# Patient Record
Sex: Male | Born: 1965 | Race: White | Hispanic: No | Marital: Married | Smoking: Never smoker
Health system: Southern US, Community
[De-identification: ages and names within clinical notes are randomized; demographics above are authoritative.]

## PROBLEM LIST (undated history)

## (undated) DIAGNOSIS — E039 Hypothyroidism, unspecified: Secondary | ICD-10-CM

## (undated) DIAGNOSIS — E785 Hyperlipidemia, unspecified: Secondary | ICD-10-CM

## (undated) DIAGNOSIS — F419 Anxiety disorder, unspecified: Secondary | ICD-10-CM

## (undated) DIAGNOSIS — M545 Low back pain, unspecified: Secondary | ICD-10-CM

## (undated) HISTORY — DX: Low back pain: M54.5

## (undated) HISTORY — PX: HERNIA REPAIR: SHX51

## (undated) HISTORY — DX: Anxiety disorder, unspecified: F41.9

## (undated) HISTORY — DX: Hyperlipidemia, unspecified: E78.5

## (undated) HISTORY — PX: CARDIAC CATHETERIZATION: SHX172

## (undated) HISTORY — DX: Hypothyroidism, unspecified: E03.9

## (undated) HISTORY — DX: Low back pain, unspecified: M54.50

---

## 1997-09-30 ENCOUNTER — Ambulatory Visit (HOSPITAL_COMMUNITY): Admission: RE | Admit: 1997-09-30 | Discharge: 1997-09-30 | Payer: Self-pay | Admitting: Gynecology

## 2002-01-07 ENCOUNTER — Encounter: Admission: RE | Admit: 2002-01-07 | Discharge: 2002-04-07 | Payer: Self-pay | Admitting: Internal Medicine

## 2002-04-06 ENCOUNTER — Encounter: Payer: Self-pay | Admitting: Neurosurgery

## 2002-04-06 ENCOUNTER — Ambulatory Visit (HOSPITAL_COMMUNITY): Admission: RE | Admit: 2002-04-06 | Discharge: 2002-04-06 | Payer: Self-pay | Admitting: Neurosurgery

## 2002-05-13 ENCOUNTER — Encounter: Admission: RE | Admit: 2002-05-13 | Discharge: 2002-08-11 | Payer: Self-pay | Admitting: Internal Medicine

## 2002-09-18 ENCOUNTER — Emergency Department (HOSPITAL_COMMUNITY): Admission: EM | Admit: 2002-09-18 | Discharge: 2002-09-18 | Payer: Self-pay

## 2002-09-19 ENCOUNTER — Encounter: Payer: Self-pay | Admitting: Emergency Medicine

## 2002-10-01 ENCOUNTER — Ambulatory Visit (HOSPITAL_COMMUNITY): Admission: RE | Admit: 2002-10-01 | Discharge: 2002-10-01 | Payer: Self-pay | Admitting: Cardiology

## 2002-10-03 ENCOUNTER — Encounter: Admission: RE | Admit: 2002-10-03 | Discharge: 2003-01-01 | Payer: Self-pay | Admitting: Internal Medicine

## 2003-04-28 ENCOUNTER — Encounter
Admission: RE | Admit: 2003-04-28 | Discharge: 2003-07-27 | Payer: Self-pay | Admitting: Physical Medicine & Rehabilitation

## 2003-05-02 ENCOUNTER — Ambulatory Visit (HOSPITAL_COMMUNITY)
Admission: RE | Admit: 2003-05-02 | Discharge: 2003-05-02 | Payer: Self-pay | Admitting: Physical Medicine & Rehabilitation

## 2003-05-19 ENCOUNTER — Encounter
Admission: RE | Admit: 2003-05-19 | Discharge: 2003-08-17 | Payer: Self-pay | Admitting: Physical Medicine & Rehabilitation

## 2003-08-26 ENCOUNTER — Encounter
Admission: RE | Admit: 2003-08-26 | Discharge: 2003-11-17 | Payer: Self-pay | Admitting: Physical Medicine & Rehabilitation

## 2003-09-16 ENCOUNTER — Ambulatory Visit: Payer: Self-pay | Admitting: Anesthesiology

## 2004-03-19 ENCOUNTER — Emergency Department (HOSPITAL_COMMUNITY): Admission: EM | Admit: 2004-03-19 | Discharge: 2004-03-19 | Payer: Self-pay | Admitting: Family Medicine

## 2004-04-13 ENCOUNTER — Encounter
Admission: RE | Admit: 2004-04-13 | Discharge: 2004-07-12 | Payer: Self-pay | Admitting: Physical Medicine & Rehabilitation

## 2004-09-29 ENCOUNTER — Encounter
Admission: RE | Admit: 2004-09-29 | Discharge: 2004-12-28 | Payer: Self-pay | Admitting: Physical Medicine & Rehabilitation

## 2004-09-29 ENCOUNTER — Ambulatory Visit: Payer: Self-pay | Admitting: Physical Medicine & Rehabilitation

## 2004-11-24 ENCOUNTER — Ambulatory Visit: Payer: Self-pay | Admitting: Physical Medicine & Rehabilitation

## 2005-01-28 ENCOUNTER — Emergency Department (HOSPITAL_COMMUNITY): Admission: EM | Admit: 2005-01-28 | Discharge: 2005-01-28 | Payer: Self-pay | Admitting: Family Medicine

## 2005-01-31 ENCOUNTER — Encounter
Admission: RE | Admit: 2005-01-31 | Discharge: 2005-05-01 | Payer: Self-pay | Admitting: Physical Medicine & Rehabilitation

## 2005-01-31 ENCOUNTER — Ambulatory Visit: Payer: Self-pay | Admitting: Physical Medicine & Rehabilitation

## 2005-02-07 ENCOUNTER — Encounter
Admission: RE | Admit: 2005-02-07 | Discharge: 2005-03-14 | Payer: Self-pay | Admitting: Physical Medicine & Rehabilitation

## 2005-03-15 ENCOUNTER — Emergency Department (HOSPITAL_COMMUNITY): Admission: EM | Admit: 2005-03-15 | Discharge: 2005-03-15 | Payer: Self-pay | Admitting: Family Medicine

## 2005-03-31 ENCOUNTER — Ambulatory Visit: Payer: Self-pay | Admitting: Endocrinology

## 2005-04-04 ENCOUNTER — Emergency Department (HOSPITAL_COMMUNITY): Admission: EM | Admit: 2005-04-04 | Discharge: 2005-04-04 | Payer: Self-pay | Admitting: Family Medicine

## 2005-04-09 ENCOUNTER — Emergency Department (HOSPITAL_COMMUNITY): Admission: EM | Admit: 2005-04-09 | Discharge: 2005-04-09 | Payer: Self-pay | Admitting: Family Medicine

## 2005-04-20 ENCOUNTER — Ambulatory Visit: Payer: Self-pay | Admitting: Physical Medicine & Rehabilitation

## 2005-04-22 ENCOUNTER — Ambulatory Visit: Payer: Self-pay | Admitting: Endocrinology

## 2005-04-30 ENCOUNTER — Emergency Department (HOSPITAL_COMMUNITY): Admission: EM | Admit: 2005-04-30 | Discharge: 2005-04-30 | Payer: Self-pay | Admitting: Emergency Medicine

## 2005-05-02 ENCOUNTER — Ambulatory Visit: Payer: Self-pay | Admitting: Endocrinology

## 2005-07-11 ENCOUNTER — Ambulatory Visit: Payer: Self-pay | Admitting: Endocrinology

## 2005-07-20 ENCOUNTER — Ambulatory Visit: Payer: Self-pay | Admitting: Physical Medicine & Rehabilitation

## 2005-07-20 ENCOUNTER — Encounter
Admission: RE | Admit: 2005-07-20 | Discharge: 2005-10-18 | Payer: Self-pay | Admitting: Physical Medicine & Rehabilitation

## 2005-08-10 ENCOUNTER — Ambulatory Visit: Payer: Self-pay | Admitting: Endocrinology

## 2005-08-31 ENCOUNTER — Ambulatory Visit: Payer: Self-pay | Admitting: Endocrinology

## 2005-09-14 ENCOUNTER — Ambulatory Visit: Payer: Self-pay | Admitting: Physical Medicine & Rehabilitation

## 2005-10-19 ENCOUNTER — Ambulatory Visit: Payer: Self-pay | Admitting: Endocrinology

## 2005-10-27 ENCOUNTER — Emergency Department (HOSPITAL_COMMUNITY): Admission: EM | Admit: 2005-10-27 | Discharge: 2005-10-27 | Payer: Self-pay | Admitting: Emergency Medicine

## 2005-10-28 ENCOUNTER — Ambulatory Visit: Payer: Self-pay | Admitting: Endocrinology

## 2005-10-31 ENCOUNTER — Ambulatory Visit: Payer: Self-pay | Admitting: Endocrinology

## 2005-11-04 ENCOUNTER — Ambulatory Visit: Payer: Self-pay | Admitting: Endocrinology

## 2005-11-14 ENCOUNTER — Ambulatory Visit: Payer: Self-pay | Admitting: Physical Medicine & Rehabilitation

## 2005-11-14 ENCOUNTER — Encounter
Admission: RE | Admit: 2005-11-14 | Discharge: 2006-02-12 | Payer: Self-pay | Admitting: Physical Medicine & Rehabilitation

## 2005-11-14 ENCOUNTER — Ambulatory Visit: Payer: Self-pay | Admitting: Endocrinology

## 2005-12-05 ENCOUNTER — Ambulatory Visit: Payer: Self-pay | Admitting: Endocrinology

## 2005-12-26 ENCOUNTER — Ambulatory Visit: Payer: Self-pay | Admitting: Internal Medicine

## 2006-01-08 ENCOUNTER — Emergency Department (HOSPITAL_COMMUNITY): Admission: EM | Admit: 2006-01-08 | Discharge: 2006-01-08 | Payer: Self-pay | Admitting: Family Medicine

## 2006-01-20 ENCOUNTER — Encounter
Admission: RE | Admit: 2006-01-20 | Discharge: 2006-04-20 | Payer: Self-pay | Admitting: Physical Medicine & Rehabilitation

## 2006-01-23 ENCOUNTER — Ambulatory Visit: Payer: Self-pay | Admitting: Physical Medicine & Rehabilitation

## 2006-03-26 ENCOUNTER — Emergency Department (HOSPITAL_COMMUNITY): Admission: EM | Admit: 2006-03-26 | Discharge: 2006-03-26 | Payer: Self-pay | Admitting: Family Medicine

## 2006-04-05 ENCOUNTER — Ambulatory Visit: Payer: Self-pay | Admitting: Endocrinology

## 2006-04-05 LAB — CONVERTED CEMR LAB
Basophils Absolute: 0.1 10*3/uL (ref 0.0–0.1)
Basophils Relative: 0.9 % (ref 0.0–1.0)
Eosinophils Relative: 2.2 % (ref 0.0–5.0)
HCT: 38.9 % — ABNORMAL LOW (ref 39.0–52.0)
Hemoglobin: 13.4 g/dL (ref 13.0–17.0)
Neutrophils Relative %: 68.5 % (ref 43.0–77.0)
RBC: 4.74 M/uL (ref 4.22–5.81)
RDW: 12.5 % (ref 11.5–14.6)
WBC: 6 10*3/uL (ref 4.5–10.5)

## 2006-04-13 ENCOUNTER — Ambulatory Visit: Payer: Self-pay | Admitting: Physical Medicine & Rehabilitation

## 2006-04-20 ENCOUNTER — Ambulatory Visit: Payer: Self-pay | Admitting: Critical Care Medicine

## 2006-06-09 ENCOUNTER — Ambulatory Visit: Payer: Self-pay | Admitting: Internal Medicine

## 2006-06-12 ENCOUNTER — Encounter: Payer: Self-pay | Admitting: Internal Medicine

## 2006-06-12 LAB — CONVERTED CEMR LAB
BUN: 11 mg/dL (ref 6–23)
CO2: 31 meq/L (ref 19–32)
Calcium: 9.1 mg/dL (ref 8.4–10.5)
Chloride: 105 meq/L (ref 96–112)
Collection Interval-CRCL: 24 hr
Creatinine Clearance: 169 mL/min — ABNORMAL HIGH (ref 75–125)
Creatinine, Ser: 0.9 mg/dL (ref 0.4–1.5)
Creatinine, Urine: 100.3 mg/dL
GFR calc Af Amer: 121 mL/min
TSH: 8.8 microintl units/mL — ABNORMAL HIGH (ref 0.35–5.50)

## 2006-07-05 ENCOUNTER — Ambulatory Visit: Payer: Self-pay | Admitting: Endocrinology

## 2006-07-29 ENCOUNTER — Encounter: Payer: Self-pay | Admitting: Endocrinology

## 2006-07-29 DIAGNOSIS — E039 Hypothyroidism, unspecified: Secondary | ICD-10-CM | POA: Insufficient documentation

## 2006-07-29 DIAGNOSIS — F411 Generalized anxiety disorder: Secondary | ICD-10-CM | POA: Insufficient documentation

## 2006-07-29 DIAGNOSIS — E109 Type 1 diabetes mellitus without complications: Secondary | ICD-10-CM | POA: Insufficient documentation

## 2006-08-28 ENCOUNTER — Encounter
Admission: RE | Admit: 2006-08-28 | Discharge: 2006-11-26 | Payer: Self-pay | Admitting: Physical Medicine & Rehabilitation

## 2006-08-28 ENCOUNTER — Ambulatory Visit: Payer: Self-pay | Admitting: Physical Medicine & Rehabilitation

## 2006-09-24 ENCOUNTER — Emergency Department (HOSPITAL_COMMUNITY): Admission: EM | Admit: 2006-09-24 | Discharge: 2006-09-24 | Payer: Self-pay | Admitting: Emergency Medicine

## 2006-10-09 ENCOUNTER — Ambulatory Visit: Payer: Self-pay | Admitting: Physical Medicine & Rehabilitation

## 2006-11-29 ENCOUNTER — Ambulatory Visit: Payer: Self-pay | Admitting: Physical Medicine & Rehabilitation

## 2006-11-29 ENCOUNTER — Encounter
Admission: RE | Admit: 2006-11-29 | Discharge: 2006-12-01 | Payer: Self-pay | Admitting: Physical Medicine & Rehabilitation

## 2006-12-29 ENCOUNTER — Ambulatory Visit: Payer: Self-pay | Admitting: Endocrinology

## 2006-12-29 DIAGNOSIS — E7800 Pure hypercholesterolemia, unspecified: Secondary | ICD-10-CM | POA: Insufficient documentation

## 2006-12-29 DIAGNOSIS — G619 Inflammatory polyneuropathy, unspecified: Secondary | ICD-10-CM | POA: Insufficient documentation

## 2006-12-29 DIAGNOSIS — G622 Polyneuropathy due to other toxic agents: Secondary | ICD-10-CM

## 2006-12-31 ENCOUNTER — Encounter: Payer: Self-pay | Admitting: Endocrinology

## 2006-12-31 LAB — CONVERTED CEMR LAB
BUN: 14 mg/dL (ref 6–23)
Creatinine, Ser: 1 mg/dL (ref 0.4–1.5)
GFR calc Af Amer: 107 mL/min
Hgb A1c MFr Bld: 7.8 % — ABNORMAL HIGH (ref 4.6–6.0)
Potassium: 4.4 meq/L (ref 3.5–5.1)
TSH: 6.48 microintl units/mL — ABNORMAL HIGH (ref 0.35–5.50)
Total CHOL/HDL Ratio: 6.1
Triglycerides: 172 mg/dL — ABNORMAL HIGH (ref 0–149)
VLDL: 34 mg/dL (ref 0–40)

## 2007-01-18 ENCOUNTER — Emergency Department (HOSPITAL_COMMUNITY): Admission: EM | Admit: 2007-01-18 | Discharge: 2007-01-18 | Payer: Self-pay | Admitting: Family Medicine

## 2007-01-29 ENCOUNTER — Encounter
Admission: RE | Admit: 2007-01-29 | Discharge: 2007-04-29 | Payer: Self-pay | Admitting: Physical Medicine & Rehabilitation

## 2007-01-29 ENCOUNTER — Ambulatory Visit: Payer: Self-pay | Admitting: Physical Medicine & Rehabilitation

## 2007-03-02 ENCOUNTER — Ambulatory Visit: Payer: Self-pay | Admitting: Physical Medicine & Rehabilitation

## 2007-03-20 ENCOUNTER — Encounter: Payer: Self-pay | Admitting: Endocrinology

## 2007-04-03 ENCOUNTER — Ambulatory Visit: Payer: Self-pay | Admitting: Physical Medicine & Rehabilitation

## 2007-04-03 ENCOUNTER — Encounter
Admission: RE | Admit: 2007-04-03 | Discharge: 2007-07-02 | Payer: Self-pay | Admitting: Physical Medicine & Rehabilitation

## 2007-04-17 ENCOUNTER — Telehealth (INDEPENDENT_AMBULATORY_CARE_PROVIDER_SITE_OTHER): Payer: Self-pay | Admitting: *Deleted

## 2007-05-08 ENCOUNTER — Ambulatory Visit: Payer: Self-pay | Admitting: Physical Medicine & Rehabilitation

## 2007-05-28 ENCOUNTER — Ambulatory Visit: Payer: Self-pay | Admitting: Physical Medicine & Rehabilitation

## 2007-06-22 ENCOUNTER — Encounter
Admission: RE | Admit: 2007-06-22 | Discharge: 2007-09-20 | Payer: Self-pay | Admitting: Physical Medicine & Rehabilitation

## 2007-06-25 ENCOUNTER — Ambulatory Visit: Payer: Self-pay | Admitting: Physical Medicine & Rehabilitation

## 2007-07-24 ENCOUNTER — Ambulatory Visit: Payer: Self-pay | Admitting: Physical Medicine & Rehabilitation

## 2007-09-06 ENCOUNTER — Ambulatory Visit: Payer: Self-pay | Admitting: Physical Medicine & Rehabilitation

## 2007-10-04 ENCOUNTER — Encounter
Admission: RE | Admit: 2007-10-04 | Discharge: 2007-10-08 | Payer: Self-pay | Admitting: Physical Medicine & Rehabilitation

## 2007-10-08 ENCOUNTER — Ambulatory Visit: Payer: Self-pay | Admitting: Physical Medicine & Rehabilitation

## 2007-12-14 ENCOUNTER — Emergency Department (HOSPITAL_COMMUNITY): Admission: EM | Admit: 2007-12-14 | Discharge: 2007-12-14 | Payer: Self-pay | Admitting: Family Medicine

## 2007-12-27 ENCOUNTER — Encounter
Admission: RE | Admit: 2007-12-27 | Discharge: 2007-12-31 | Payer: Self-pay | Admitting: Physical Medicine & Rehabilitation

## 2007-12-31 ENCOUNTER — Ambulatory Visit: Payer: Self-pay | Admitting: Physical Medicine & Rehabilitation

## 2008-01-30 ENCOUNTER — Encounter: Payer: Self-pay | Admitting: Endocrinology

## 2008-03-17 ENCOUNTER — Emergency Department (HOSPITAL_COMMUNITY): Admission: EM | Admit: 2008-03-17 | Discharge: 2008-03-17 | Payer: Self-pay | Admitting: Family Medicine

## 2008-03-25 ENCOUNTER — Ambulatory Visit: Payer: Self-pay | Admitting: Endocrinology

## 2008-03-25 ENCOUNTER — Encounter
Admission: RE | Admit: 2008-03-25 | Discharge: 2008-03-25 | Payer: Self-pay | Admitting: Physical Medicine & Rehabilitation

## 2008-03-25 DIAGNOSIS — J309 Allergic rhinitis, unspecified: Secondary | ICD-10-CM | POA: Insufficient documentation

## 2008-06-03 ENCOUNTER — Emergency Department (HOSPITAL_COMMUNITY): Admission: EM | Admit: 2008-06-03 | Discharge: 2008-06-03 | Payer: Self-pay | Admitting: Family Medicine

## 2008-10-16 ENCOUNTER — Encounter
Admission: RE | Admit: 2008-10-16 | Discharge: 2008-10-20 | Payer: Self-pay | Admitting: Physical Medicine & Rehabilitation

## 2008-10-20 ENCOUNTER — Ambulatory Visit: Payer: Self-pay | Admitting: Physical Medicine & Rehabilitation

## 2008-10-28 ENCOUNTER — Ambulatory Visit: Payer: Self-pay | Admitting: Endocrinology

## 2008-10-28 ENCOUNTER — Telehealth: Payer: Self-pay | Admitting: Endocrinology

## 2008-10-28 DIAGNOSIS — R059 Cough, unspecified: Secondary | ICD-10-CM | POA: Insufficient documentation

## 2008-10-28 DIAGNOSIS — R05 Cough: Secondary | ICD-10-CM

## 2008-11-05 ENCOUNTER — Telehealth: Payer: Self-pay | Admitting: Endocrinology

## 2008-11-05 ENCOUNTER — Encounter: Payer: Self-pay | Admitting: Endocrinology

## 2008-11-11 ENCOUNTER — Telehealth: Payer: Self-pay | Admitting: Endocrinology

## 2008-12-25 ENCOUNTER — Encounter
Admission: RE | Admit: 2008-12-25 | Discharge: 2009-01-07 | Payer: Self-pay | Admitting: Physical Medicine & Rehabilitation

## 2008-12-25 ENCOUNTER — Ambulatory Visit: Payer: Self-pay | Admitting: Physical Medicine & Rehabilitation

## 2009-01-05 ENCOUNTER — Telehealth: Payer: Self-pay | Admitting: Endocrinology

## 2009-01-23 ENCOUNTER — Encounter
Admission: RE | Admit: 2009-01-23 | Discharge: 2009-04-23 | Payer: Self-pay | Admitting: Physical Medicine & Rehabilitation

## 2009-02-20 ENCOUNTER — Ambulatory Visit: Payer: Self-pay | Admitting: Physical Medicine & Rehabilitation

## 2009-05-13 ENCOUNTER — Ambulatory Visit: Payer: Self-pay | Admitting: Internal Medicine

## 2009-05-13 DIAGNOSIS — N529 Male erectile dysfunction, unspecified: Secondary | ICD-10-CM | POA: Insufficient documentation

## 2009-05-13 DIAGNOSIS — L989 Disorder of the skin and subcutaneous tissue, unspecified: Secondary | ICD-10-CM | POA: Insufficient documentation

## 2009-08-06 ENCOUNTER — Encounter (INDEPENDENT_AMBULATORY_CARE_PROVIDER_SITE_OTHER): Payer: Self-pay | Admitting: *Deleted

## 2009-08-06 ENCOUNTER — Ambulatory Visit: Payer: Self-pay | Admitting: Internal Medicine

## 2009-08-06 DIAGNOSIS — J019 Acute sinusitis, unspecified: Secondary | ICD-10-CM | POA: Insufficient documentation

## 2009-08-06 DIAGNOSIS — R21 Rash and other nonspecific skin eruption: Secondary | ICD-10-CM | POA: Insufficient documentation

## 2009-08-06 DIAGNOSIS — E538 Deficiency of other specified B group vitamins: Secondary | ICD-10-CM | POA: Insufficient documentation

## 2009-08-06 DIAGNOSIS — E559 Vitamin D deficiency, unspecified: Secondary | ICD-10-CM | POA: Insufficient documentation

## 2009-08-06 DIAGNOSIS — R5383 Other fatigue: Secondary | ICD-10-CM

## 2009-08-06 DIAGNOSIS — R5381 Other malaise: Secondary | ICD-10-CM | POA: Insufficient documentation

## 2009-08-07 ENCOUNTER — Ambulatory Visit: Payer: Self-pay | Admitting: Internal Medicine

## 2009-08-07 LAB — CONVERTED CEMR LAB
ALT: 16 units/L (ref 0–53)
Alkaline Phosphatase: 86 units/L (ref 39–117)
Basophils Relative: 0.4 % (ref 0.0–3.0)
Bilirubin, Direct: 0.1 mg/dL (ref 0.0–0.3)
Calcium: 8.5 mg/dL (ref 8.4–10.5)
Creatinine, Ser: 0.9 mg/dL (ref 0.4–1.5)
Direct LDL: 95.2 mg/dL
Eosinophils Relative: 3.9 % (ref 0.0–5.0)
Hgb A1c MFr Bld: 7.8 % — ABNORMAL HIGH (ref 4.6–6.5)
Lymphocytes Relative: 20.4 % (ref 12.0–46.0)
Microalb Creat Ratio: 0.5 mg/g (ref 0.0–30.0)
Microalb, Ur: 1.6 mg/dL (ref 0.0–1.9)
Monocytes Relative: 7.3 % (ref 3.0–12.0)
Neutrophils Relative %: 68 % (ref 43.0–77.0)
PSA: 0.39 ng/mL (ref 0.10–4.00)
RBC: 4.76 M/uL (ref 4.22–5.81)
Saturation Ratios: 27 % (ref 20.0–50.0)
Total CHOL/HDL Ratio: 5
Total Protein: 7.3 g/dL (ref 6.0–8.3)
Triglycerides: 305 mg/dL — ABNORMAL HIGH (ref 0.0–149.0)
WBC: 6.5 10*3/uL (ref 4.5–10.5)

## 2009-09-09 ENCOUNTER — Ambulatory Visit: Payer: Self-pay | Admitting: Internal Medicine

## 2009-11-24 ENCOUNTER — Telehealth: Payer: Self-pay | Admitting: Endocrinology

## 2009-11-27 ENCOUNTER — Ambulatory Visit: Payer: Self-pay | Admitting: Endocrinology

## 2009-12-22 ENCOUNTER — Ambulatory Visit: Payer: Self-pay | Admitting: Endocrinology

## 2009-12-22 DIAGNOSIS — R109 Unspecified abdominal pain: Secondary | ICD-10-CM | POA: Insufficient documentation

## 2009-12-28 ENCOUNTER — Ambulatory Visit: Payer: Self-pay | Admitting: Endocrinology

## 2010-01-06 ENCOUNTER — Ambulatory Visit: Payer: Self-pay | Admitting: Endocrinology

## 2010-01-28 ENCOUNTER — Ambulatory Visit
Admission: RE | Admit: 2010-01-28 | Discharge: 2010-01-28 | Payer: Self-pay | Source: Home / Self Care | Attending: Endocrinology | Admitting: Endocrinology

## 2010-02-03 ENCOUNTER — Encounter: Payer: Self-pay | Admitting: Endocrinology

## 2010-02-08 ENCOUNTER — Encounter: Payer: Self-pay | Admitting: Endocrinology

## 2010-02-09 NOTE — Assessment & Plan Note (Signed)
Summary: red raised mole on back/sae/cd   Vital Signs:  Patient profile:   45 year old male Height:      73 inches Weight:      242.25 pounds BMI:     32.08 O2 Sat:      97 % on Room air Temp:     98 degrees F oral Pulse rate:   73 / minute BP sitting:   110 / 84  (left arm) Cuff size:   large  Vitals Entered ByZella Ball Ewing (May 13, 2009 2:35 PM)  O2 Flow:  Room air CC: Mole on back/RE   CC:  Mole on back/RE.  History of Present Illness: here with darkened area around a red lesion to the left scapular area and wife urged him to come have looked at , worried about melanoma.  Also with 4 mo worsening ED symptoms, used to be infreq but now just about every instance over last 4 months and becoming signficant problem.  Pt denies CP, sob, doe, wheezing, orthopnea, pnd, worsening LE edema, palps, dizziness or syncope   Pt denies new neuro symptoms such as headache, facial or extremity weakness .  Denies worsening depressive symptoms, stress, anxeity, panic or suiciidal ideation.    Preventive Screening-Counseling & Management  Alcohol-Tobacco     Smoking Status: never      Drug Use:  no.    Problems Prior to Update: 1)  Cough  (ICD-786.2) 2)  Allergic Rhinitis  (ICD-477.9) 3)  Polyneuropathy  (ICD-357.9) 4)  Hypercholesterolemia  (ICD-272.0) 5)  Hypothyroidism  (ICD-244.9) 6)  Diabetes Mellitus, Type I  (ICD-250.01) 7)  Anxiety  (ICD-300.00)  Medications Prior to Update: 1)  Insulin Pump [id number]   Kit (Insulin Infusion Pump) .... See Office Notes 2)  Synthroid 125 Mcg  Tabs (Levothyroxine Sodium) .... Take 1 By Mouth Qd 3)  Trazodone Hcl 50 Mg  Tabs (Trazodone Hcl) .... Take 1 By Mouth Qhs 4)  Crestor 40 Mg  Tabs (Rosuvastatin Calcium) .... Take 1/2 Tab By Mouth Qd 5)  Endocet 7.5-325 Mg  Tabs (Oxycodone-Acetaminophen) .... Take 1 By Mouth Qd 6)  Lisinopril 5 Mg  Tabs (Lisinopril) .... Take 1 By Mouth Qd 7)  Ultram Er 300 Mg  Tb24 (Tramadol Hcl) .... Take 1 By Mouth Qd 8)   Neurontin 400 Mg  Caps (Gabapentin) .... Take 1 By Mouth Three Times A Day Qd 9)  Cefuroxime Axetil 250 Mg Tabs (Cefuroxime Axetil) .Marland Kitchen.. 1 Bid 10)  Benzonatate 100 Mg Caps (Benzonatate) .Marland Kitchen.. 1 Three Times A Day As Needed Cough 11)  Onetouch Ultra Test  Strp (Glucose Blood) .... Test Blood Sugar 5 Times A Day  Current Medications (verified): 1)  Insulin Pump [id number]   Kit (Insulin Infusion Pump) .... See Office Notes 2)  Synthroid 125 Mcg  Tabs (Levothyroxine Sodium) .... Take 1 By Mouth Qd 3)  Trazodone Hcl 50 Mg  Tabs (Trazodone Hcl) .... Take 1 By Mouth Qhs 4)  Crestor 40 Mg  Tabs (Rosuvastatin Calcium) .... Take 1/2 Tab By Mouth Qd 5)  Endocet 7.5-325 Mg  Tabs (Oxycodone-Acetaminophen) .... Take 1 By Mouth Qd 6)  Lisinopril 5 Mg  Tabs (Lisinopril) .... Take 1 By Mouth Qd 7)  Ultram Er 300 Mg  Tb24 (Tramadol Hcl) .... Take 1 By Mouth Qd 8)  Neurontin 400 Mg  Caps (Gabapentin) .... Take 1 By Mouth Three Times A Day Qd 9)  Cefuroxime Axetil 250 Mg Tabs (Cefuroxime Axetil) .Marland Kitchen.. 1 Bid 10)  Benzonatate 100 Mg Caps (Benzonatate) .Marland Kitchen.. 1 Three Times A Day As Needed Cough 11)  Onetouch Ultra Test  Strp (Glucose Blood) .... Test Blood Sugar 5 Times A Day 12)  Cialis 20 Mg Tabs (Tadalafil) .Marland Kitchen.. 1 Po Every Other Day As Needed  Allergies (verified): No Known Drug Allergies  Past History:  Past Medical History: Last updated: 07/29/2006 Anxiety Diabetes mellitus, type I Low back pain (NL) Cath (09811 Dylipidemia Hypothyroidism  Past Surgical History: Last updated: 07/29/2006 EKG (07/05/2006)  Risk Factors: Smoking Status: never (05/13/2009)  Social History: Reviewed history and no changes required. Married Never Smoked Drug use-no Drug Use:  no  Review of Systems       all otherwise negative per pt -    Physical Exam  General:  alert and overweight-appearing.   Head:  normocephalic and atraumatic.   Eyes:  vision grossly intact, pupils equal, and pupils round.   Ears:  R ear  normal and L ear normal.   Nose:  no external deformity and no nasal discharge.   Mouth:  no gingival abnormalities and pharynx pink and moist.   Neck:  supple and no masses.   Lungs:  normal respiratory effort and normal breath sounds.   Heart:  normal rate and regular rhythm.   Genitalia:  Testes bilaterally descended without nodularity, tenderness or masses. No scrotal masses or lesions. No penis lesions or urethral discharge. Extremities:  no edema, no erythema  Skin:  color normal and no rashes.  ; does have a left periscapular cherry angioma approx 5 mm, with mild  slight darkened area nonraised in circular area around it approx 10 mm c/w postinflammatory hyperpigmentation (has been scratching at it) - no characteristic of melanoma Psych:  not depressed appearing and moderately anxious.     Impression & Recommendations:  Problem # 1:  SKIN LESION (ICD-709.9) d/w pt - benign in appearance and  area around the cherry angioma should fade with time;  ok to follow  Problem # 2:  ERECTILE DYSFUNCTION, ORGANIC (ICD-607.84)  His updated medication list for this problem includes:    Cialis 20 Mg Tabs (Tadalafil) .Marland Kitchen... 1 po every other day as needed most likely organic given comorbids, but cant r/o psych component;  treat as above, f/u any worsening signs or symptoms   Problem # 3:  ANXIETY (ICD-300.00)  His updated medication list for this problem includes:    Trazodone Hcl 50 Mg Tabs (Trazodone hcl) .Marland Kitchen... Take 1 by mouth qhs stable overall by hx and exam, ok to continue meds/tx as is   Complete Medication List: 1)  Insulin Pump Ir1250 Kit (Insulin infusion pump) .... See office notes 2)  Synthroid 125 Mcg Tabs (Levothyroxine sodium) .... Take 1 by mouth qd 3)  Trazodone Hcl 50 Mg Tabs (Trazodone hcl) .... Take 1 by mouth qhs 4)  Crestor 40 Mg Tabs (Rosuvastatin calcium) .... Take 1/2 tab by mouth qd 5)  Endocet 7.5-325 Mg Tabs (Oxycodone-acetaminophen) .... Take 1 by mouth qd 6)   Lisinopril 5 Mg Tabs (Lisinopril) .... Take 1 by mouth qd 7)  Ultram Er 300 Mg Tb24 (Tramadol hcl) .... Take 1 by mouth qd 8)  Neurontin 400 Mg Caps (Gabapentin) .... Take 1 by mouth three times a day qd 9)  Cefuroxime Axetil 250 Mg Tabs (Cefuroxime axetil) .Marland Kitchen.. 1 bid 10)  Benzonatate 100 Mg Caps (Benzonatate) .Marland Kitchen.. 1 three times a day as needed cough 11)  Onetouch Ultra Test Strp (Glucose blood) .... Test blood sugar  5 times a day 12)  Cialis 20 Mg Tabs (Tadalafil) .Marland Kitchen.. 1 po every other day as needed   Patient Instructions: 1)  please use the free coupon and prescription for cialis, and the regular prescription as needed 2)  you can call to change to viagra if you want, if the cialis does not seem to be effective 3)  Please schedule an appointment with your primary doctor as needed Prescriptions: CIALIS 20 MG TABS (TADALAFIL) 1 po every other day as needed  #10 x 11   Entered and Authorized by:   Corwin Levins MD   Signed by:   Corwin Levins MD on 05/13/2009   Method used:   Print then Give to Patient   RxID:   [id number] CIALIS 20 MG TABS (TADALAFIL) 1 po every other day as needed  #3 x 0   Entered and Authorized by:   Corwin Levins MD   Signed by:   Corwin Levins MD on 05/13/2009   Method used:   Print then Give to Patient   RxID:   [id number]

## 2010-02-09 NOTE — Assessment & Plan Note (Signed)
Summary: PER ROBIN SCHED--B12 --JWJ---STC  Nurse Visit   Allergies: No Known Drug Allergies  Medication Administration  Injection # 1:    Medication: Vit B12 1000 mcg    Diagnosis: VITAMIN B12 DEFICIENCY (ICD-266.2)    Route: IM    Site: R deltoid    Exp Date: 04/11/2011    Lot #: [id number]    Mfr: American Regent    Patient tolerated injection without complications    Given by: Margaret Pyle, CMA (August 07, 2009 9:40 AM)  Orders Added: 1)  Admin of Therapeutic Inj  intramuscular or subcutaneous [96372] 2)  Vit B12 1000 mcg [J3420]

## 2010-02-09 NOTE — Progress Notes (Signed)
  Phone Note Call from Patient Call back at Home Phone [phone]   Caller: Patient Summary of Call: Pt called requesting directions on test strips be changed from five times a day to 8-10 per day as this is how many times pt actuallly checks CBGs due to Insulin pump, Pt is also requesting a 1 year Rx at a time. Initial call taken by: Margaret Pyle, CMA,  November 24, 2009 2:19 PM  Follow-up for Phone Call        ov is due.  please refill x 1, pending this ov.   Follow-up by: Minus Breeding MD,  November 24, 2009 2:43 PM    New/Updated Medications: ONETOUCH ULTRA TEST  STRP (GLUCOSE BLOOD) Test blood sugar 8-9 times daily Prescriptions: ONETOUCH ULTRA TEST  STRP (GLUCOSE BLOOD) Test blood sugar 8-9 times daily  #300 x 0   Entered by:   Margaret Pyle, CMA   Authorized by:   Minus Breeding MD   Signed by:   Margaret Pyle, CMA on 11/24/2009   Method used:   Electronically to        [facility]  [address] [id number]* (retail)       [address]       [place], [place]  [address]       Ph: [phone] or [phone]       Fax: [phone]   RxID:   [id number]

## 2010-02-09 NOTE — Assessment & Plan Note (Signed)
Summary: PER WIFE --1 MTH B12--JWJ--STC  Nurse Visit   Allergies: No Known Drug Allergies  Medication Administration  Injection # 1:    Medication: Vit B12 1000 mcg    Diagnosis: VITAMIN B12 DEFICIENCY (ICD-266.2)    Route: IM    Site: L deltoid    Exp Date: 04/11/2011    Lot #: [id number]    Mfr: American Regent    Patient tolerated injection without complications    Given by: Margaret Pyle, CMA (September 09, 2009 9:56 AM)  Orders Added: 1)  Admin of Therapeutic Inj  intramuscular or subcutaneous [96372] 2)  Vit B12 1000 mcg [J3420]

## 2010-02-09 NOTE — Letter (Signed)
Summary: Out of Work  [facility]  [address] [place], [place] [address]   Phone: [phone]  Fax: [phone]    August 06, 2009   Employee:  MARSHON BANGS Arteaga    To Whom It May Concern:   For Medical reasons, please excuse the above named employee from work for the following dates:  Start:   August 05, 2009  End:   August 06, 2909  If you need additional information, please feel free to contact our office.         Sincerely,    Dr. Oliver Barre

## 2010-02-09 NOTE — Assessment & Plan Note (Signed)
Summary: PER WIFE B12 INJ  SAE--STC  Nurse Visit   Allergies: No Known Drug Allergies  Medication Administration  Injection # 1:    Medication: Vit B12 1000 mcg    Diagnosis: VITAMIN B12 DEFICIENCY (ICD-266.2)    Route: IM    Site: R deltoid    Exp Date: 08/11/2011    Lot #: [id number]    Mfr: American Regent    Patient tolerated injection without complications    Given by: Margaret Pyle, CMA (November 27, 2009 10:45 AM)  Orders Added: 1)  Admin of Therapeutic Inj  intramuscular or subcutaneous [96372] 2)  Vit B12 1000 mcg [J3420]

## 2010-02-09 NOTE — Miscellaneous (Signed)
  Clinical Lists Changes  Problems: Added new problem of VITAMIN B12 DEFICIENCY (ICD-266.2) Medications: Added new medication of CYANOCOBALAMIN 1000 MCG/ML SOLN (CYANOCOBALAMIN) 1 cc IM q month 

## 2010-02-09 NOTE — Assessment & Plan Note (Signed)
Summary: swollen under left eye/sae/cd   Vital Signs:  Patient profile:   45 year old male Height:      73 inches Weight:      241.50 pounds BMI:     31.98 O2 Sat:      97 % on Room air Temp:     98.6 degrees F oral Pulse rate:   96 / minute BP sitting:   112 / 72  (left arm) Cuff size:   large  Vitals Entered By: Zella Ball Ewing CMA (AAMA) (August 06, 2009 11:14 AM)  O2 Flow:  Room air CC: Swollen, red and burning under left eye/RE   CC:  Swollen and red and burning under left eye/RE.  History of Present Illness: here with acute onset x 3 days with facial pain, pressure, fever and greenish d/c;  also with mild headache, nausea but on abd pain, vomiting or diarrhea.  No rash or joint problems.  Also with a small rash to the skin of the face just below the left eye with burning and itching, but cannot recall any specific  contact to cause a rash such as poison ivy or other.  Also with unsually marked fatigue with this illness and requests labs.  Pt denies polydipsia, polyuria, or low sugar symptoms such as shakiness improved with eating.  Overall good compliance with meds, trying to follow low chol, DM diet, wt stable, little excercise however   Problems Prior to Update: 1)  Special Screening Malig Neoplasms Other Sites  (ICD-V76.49) 2)  Vitamin D Deficiency  (ICD-268.9) 3)  Fatigue  (ICD-780.79) 4)  Rash-nonvesicular  (ICD-782.1) 5)  Rash-nonvesicular  (ICD-782.1) 6)  Sinusitis- Acute-nos  (ICD-461.9) 7)  Erectile Dysfunction, Organic  (ICD-607.84) 8)  Skin Lesion  (ICD-709.9) 9)  Cough  (ICD-786.2) 10)  Allergic Rhinitis  (ICD-477.9) 11)  Polyneuropathy  (ICD-357.9) 12)  Hypercholesterolemia  (ICD-272.0) 13)  Hypothyroidism  (ICD-244.9) 14)  Diabetes Mellitus, Type I  (ICD-250.01) 15)  Anxiety  (ICD-300.00)  Medications Prior to Update: 1)  Insulin Pump [id number]   Kit (Insulin Infusion Pump) .... See Office Notes 2)  Synthroid 125 Mcg  Tabs (Levothyroxine Sodium) .... Take 1 By  Mouth Qd 3)  Trazodone Hcl 50 Mg  Tabs (Trazodone Hcl) .... Take 1 By Mouth Qhs 4)  Crestor 40 Mg  Tabs (Rosuvastatin Calcium) .... Take 1/2 Tab By Mouth Qd 5)  Endocet 7.5-325 Mg  Tabs (Oxycodone-Acetaminophen) .... Take 1 By Mouth Qd 6)  Lisinopril 5 Mg  Tabs (Lisinopril) .... Take 1 By Mouth Qd 7)  Ultram Er 300 Mg  Tb24 (Tramadol Hcl) .... Take 1 By Mouth Qd 8)  Neurontin 400 Mg  Caps (Gabapentin) .... Take 1 By Mouth Three Times A Day Qd 9)  Cefuroxime Axetil 250 Mg Tabs (Cefuroxime Axetil) .Marland Kitchen.. 1 Bid 10)  Benzonatate 100 Mg Caps (Benzonatate) .Marland Kitchen.. 1 Three Times A Day As Needed Cough 11)  Onetouch Ultra Test  Strp (Glucose Blood) .... Test Blood Sugar 5 Times A Day 12)  Cialis 20 Mg Tabs (Tadalafil) .Marland Kitchen.. 1 Po Every Other Day As Needed  Current Medications (verified): 1)  Insulin Pump Ir1250   Kit (Insulin Infusion Pump) .... See Office Notes 2)  Synthroid 125 Mcg  Tabs (Levothyroxine Sodium) .... Take 1 By Mouth Qd 3)  Trazodone Hcl 50 Mg  Tabs (Trazodone Hcl) .... Take 1 By Mouth Qhs 4)  Crestor 40 Mg  Tabs (Rosuvastatin Calcium) .... Take 1/2 Tab By Mouth Qd 5)  Endocet 7.5-325  Mg  Tabs (Oxycodone-Acetaminophen) .... Take 1 By Mouth Qd 6)  Lisinopril 5 Mg  Tabs (Lisinopril) .... Take 1 By Mouth Qd 7)  Ultram Er 300 Mg  Tb24 (Tramadol Hcl) .... Take 1 By Mouth Qd 8)  Neurontin 400 Mg  Caps (Gabapentin) .... Take 1 By Mouth Three Times A Day Qd 9)  Cefuroxime Axetil 250 Mg Tabs (Cefuroxime Axetil) .Marland Kitchen.. 1 Bid 10)  Benzonatate 100 Mg Caps (Benzonatate) .Marland Kitchen.. 1 Three Times A Day As Needed Cough 11)  Onetouch Ultra Test  Strp (Glucose Blood) .... Test Blood Sugar 5 Times A Day 12)  Cialis 20 Mg Tabs (Tadalafil) .Marland Kitchen.. 1 Po Every Other Day As Needed 13)  Cephalexin 500 Mg Caps (Cephalexin) .Marland Kitchen.. 1po Three Times A Day 14)  Promethazine Hcl 25 Mg Tabs (Promethazine Hcl) .Marland Kitchen.. 1 By Mouth Q 6 Hrs As Needed Nausea 15)  Triamcinolone Acetonide 0.1 % Crea (Triamcinolone Acetonide) .... Use Asd Two  Times A Day As Needed  Allergies (verified): No Known Drug Allergies  Past History:  Past Medical History: Last updated: 07/29/2006 Anxiety Diabetes mellitus, type I Low back pain (NL) Cath (78295 Dylipidemia Hypothyroidism  Past Surgical History: Last updated: 07/29/2006 EKG (07/05/2006)  Social History: Last updated: 05/13/2009 Married Never Smoked Drug use-no  Risk Factors: Smoking Status: never (05/13/2009)  Review of Systems       all otherwise negative per pt -    Physical Exam  General:  alert and overweight-appearing.   Head:  normocephalic and atraumatic.   Eyes:  vision grossly intact, pupils equal, and pupils round.   Ears:  bilat tm's mild red, sinus tender bilat Nose:  nasal dischargemucosal pallor and mucosal edema.   Mouth:  pharyngeal erythema and fair dentition.   Neck:  supple and no masses.   Lungs:  normal respiratory effort and normal breath sounds.   Heart:  normal rate and regular rhythm.   Abdomen:  soft, non-tender, and normal bowel sounds.   Extremities:  no edema, no erythema  Skin:  left face below the eye with a linear streak like rash approx 1 cm area without tenderness or swelling   Impression & Recommendations:  Problem # 1:  SINUSITIS- ACUTE-NOS (ICD-461.9)  His updated medication list for this problem includes:    Cefuroxime Axetil 250 Mg Tabs (Cefuroxime axetil) .Marland Kitchen... 1 bid    Benzonatate 100 Mg Caps (Benzonatate) .Marland Kitchen... 1 three times a day as needed cough    Cephalexin 500 Mg Caps (Cephalexin) .Marland Kitchen... 1po three times a day treat as above, f/u any worsening signs or symptoms   Problem # 2:  RASH-NONVESICULAR (ICD-782.1)  His updated medication list for this problem includes:    Triamcinolone Acetonide 0.1 % Crea (Triamcinolone acetonide) ..... Use asd two times a day as needed treat as above, f/u any worsening signs or symptoms , d/w mild dermatitis left face below eyelid  Problem # 3:  DIABETES MELLITUS, TYPE I  (ICD-250.01)  His updated medication list for this problem includes:    Lisinopril 5 Mg Tabs (Lisinopril) .Marland Kitchen... Take 1 by mouth qd stable overall by hx and exam, ok to continue meds/tx as is , Pt to cont DM diet, excercise, wt loss efforts; to check labs today , f/u with Dr Everardo All;  also has [facility] on regular basis  His updated medication list for this problem includes:    Lisinopril 5 Mg Tabs (Lisinopril) .Marland Kitchen... Take 1 by mouth qd  Orders: TLB-Lipid Panel (80061-LIPID) TLB-Microalbumin/Creat  Ratio, Urine (82043-MALB) TLB-A1C / Hgb A1C (Glycohemoglobin) (83036-A1C) stable overall by hx and exam, ok to continue meds/tx as is , Pt to cont DM diet, excercise, wt loss efforts; to check labs today   Problem # 4:  FATIGUE (ICD-780.79)  most likely due to above, exam o/w benign, to check labs below; follow with expectant management   Orders: TLB-BMP (Basic Metabolic Panel-BMET) (80048-METABOL) TLB-CBC Platelet - w/Differential (85025-CBCD) TLB-Hepatic/Liver Function Pnl (80076-HEPATIC) TLB-TSH (Thyroid Stimulating Hormone) (84443-TSH) TLB-Sedimentation Rate (ESR) (85652-ESR) TLB-IBC Pnl (Iron/FE;Transferrin) (83550-IBC) TLB-B12 + Folate Pnl (16109_60454-U98/JXB)  Complete Medication List: 1)  Insulin Pump Ir1250 Kit (Insulin infusion pump) .... See office notes 2)  Synthroid 125 Mcg Tabs (Levothyroxine sodium) .... Take 1 by mouth qd 3)  Trazodone Hcl 50 Mg Tabs (Trazodone hcl) .... Take 1 by mouth qhs 4)  Crestor 40 Mg Tabs (Rosuvastatin calcium) .... Take 1/2 tab by mouth qd 5)  Endocet 7.5-325 Mg Tabs (Oxycodone-acetaminophen) .... Take 1 by mouth qd 6)  Lisinopril 5 Mg Tabs (Lisinopril) .... Take 1 by mouth qd 7)  Ultram Er 300 Mg Tb24 (Tramadol hcl) .... Take 1 by mouth qd 8)  Neurontin 400 Mg Caps (Gabapentin) .... Take 1 by mouth three times a day qd 9)  Cefuroxime Axetil 250 Mg Tabs (Cefuroxime axetil) .Marland Kitchen.. 1 bid 10)  Benzonatate 100 Mg Caps (Benzonatate) .Marland Kitchen.. 1 three  times a day as needed cough 11)  Onetouch Ultra Test Strp (Glucose blood) .... Test blood sugar 5 times a day 12)  Cialis 20 Mg Tabs (Tadalafil) .Marland Kitchen.. 1 po every other day as needed 13)  Cephalexin 500 Mg Caps (Cephalexin) .Marland Kitchen.. 1po three times a day 14)  Promethazine Hcl 25 Mg Tabs (Promethazine hcl) .Marland Kitchen.. 1 by mouth q 6 hrs as needed nausea 15)  Triamcinolone Acetonide 0.1 % Crea (Triamcinolone acetonide) .... Use asd two times a day as needed  Other Orders: TLB-PSA (Prostate Specific Antigen) (84153-PSA)  Patient Instructions: 1)  Please take all new medications as prescribed 2)  Continue all previous medications as before this visit  3)  Please go to the Lab in the basement for your blood and/or urine tests today 4)  You are given the work note today 5)  Please schedule an appointment with your primary doctor as previously recommended Prescriptions: TRIAMCINOLONE ACETONIDE 0.1 % CREA (TRIAMCINOLONE ACETONIDE) use asd two times a day as needed  #1 x 0   Entered and Authorized by:   Corwin Levins MD   Signed by:   Corwin Levins MD on 08/06/2009   Method used:   Print then Give to Patient   RxID:   [id number] PROMETHAZINE HCL 25 MG TABS (PROMETHAZINE HCL) 1 by mouth q 6 hrs as needed nausea  #40 x 1   Entered and Authorized by:   Corwin Levins MD   Signed by:   Corwin Levins MD on 08/06/2009   Method used:   Print then Give to Patient   RxID:   [id number] CEPHALEXIN 500 MG CAPS (CEPHALEXIN) 1po three times a day  #30 x 0   Entered and Authorized by:   Corwin Levins MD   Signed by:   Corwin Levins MD on 08/06/2009   Method used:   Print then Give to Patient   RxID:   [id number]

## 2010-02-11 NOTE — Assessment & Plan Note (Signed)
Summary: [name]  Nurse Visit   Allergies: No Known Drug Allergies  Medication Administration  Injection # 1:    Medication: Vit B12 1000 mcg    Diagnosis: VITAMIN B12 DEFICIENCY (ICD-266.2)    Route: IM    Site: R deltoid    Exp Date: 08/2011    Lot #: [id number]    Mfr: American Regent    Patient tolerated injection without complications    Given by: Brenton Grills CMA Duncan Dull) (January 28, 2010 9:51 AM)  Orders Added: 1)  Vit B12 1000 mcg [J3420] 2)  Admin of Therapeutic Inj  intramuscular or subcutaneous [04540]

## 2010-02-11 NOTE — Assessment & Plan Note (Signed)
Summary: GROIN PAIN---STC   Vital Signs:  Patient profile:   45 year old male Height:      73 inches (185.42 cm) Weight:      246 pounds (111.82 kg) BMI:     32.57 O2 Sat:      98 % on Room air Temp:     99.1 degrees F (37.28 degrees C) oral Pulse rate:   90 / minute BP sitting:   102 / 70  (left arm) Cuff size:   large  Vitals Entered By: Brenton Grills CMA Duncan Dull) (December 22, 2009 1:59 PM)  O2 Flow:  Room air CC: Lower abdomen and groin pan/pt is no longer taking Trazadone/aj Is Patient Diabetic? Yes   CC:  Lower abdomen and groin pan/pt is no longer taking Trazadone/aj.  History of Present Illness: pt has few weeks of slight pain at the right inguinal area, worse in the context of playing basketball.  no assoc urinary sxs.    Current Medications (verified): 1)  Insulin Pump Ir1250   Kit (Insulin Infusion Pump) .... See Office Notes 2)  Synthroid 125 Mcg  Tabs (Levothyroxine Sodium) .... Take 1 By Mouth Qd 3)  Trazodone Hcl 50 Mg  Tabs (Trazodone Hcl) .... Take 1 By Mouth Qhs 4)  Crestor 40 Mg  Tabs (Rosuvastatin Calcium) .... Take 1/2 Tab By Mouth Qd 5)  Endocet 7.5-325 Mg  Tabs (Oxycodone-Acetaminophen) .... Take 1 By Mouth Qd 6)  Lisinopril 5 Mg  Tabs (Lisinopril) .... Take 1 By Mouth Qd 7)  Ultram Er 300 Mg  Tb24 (Tramadol Hcl) .... Take 1 By Mouth Qd 8)  Neurontin 400 Mg  Caps (Gabapentin) .... Take 1 By Mouth Three Times A Day Qd 9)  Onetouch Ultra Test  Strp (Glucose Blood) .... Test Blood Sugar 8-9 Times Daily 10)  Cialis 20 Mg Tabs (Tadalafil) .Marland Kitchen.. 1 Po Every Other Day As Needed 11)  Promethazine Hcl 25 Mg Tabs (Promethazine Hcl) .Marland Kitchen.. 1 By Mouth Q 6 Hrs As Needed Nausea 12)  Triamcinolone Acetonide 0.1 % Crea (Triamcinolone Acetonide) .... Use Asd Two Times A Day As Needed 13)  Cyanocobalamin 1000 Mcg/ml Soln (Cyanocobalamin) .Marland Kitchen.. 1 Cc Im Q Month  Allergies (verified): No Known Drug Allergies  Past History:  Past Medical History: Last updated:  07/29/2006 Anxiety Diabetes mellitus, type I Low back pain (NL) Cath (20040 Dylipidemia Hypothyroidism  Review of Systems       denies inguinal rash  Physical Exam  General:  normal appearance.   Genitalia:  Normal external male genitalia with no urethral discharge. no hernia is noted   Impression & Recommendations:  Problem # 1:  INGUINAL PAIN, RIGHT (ICD-789.09) Assessment New uncertain etiology  Other Orders: Est. Patient Level III (45409) Surgical Referral (Surgery)  Patient Instructions: 1)  refer to a specialist.  you will be called with a day and time for an appointment 2)  please ask that the [facility] send a copy of your blood test here. Prescriptions: ONETOUCH ULTRA TEST  STRP (GLUCOSE BLOOD) Test blood sugar 8-9 times daily  #300 x 11   Entered and Authorized by:   Minus Breeding MD   Signed by:   Minus Breeding MD on 12/22/2009   Method used:   Electronically to        [facility]  [address] # [id number]* (retail)       [address]       [facility] [place], [place]  [address]       Ph: [phone] or [phone]       Fax: [phone]   RxID:   [id number]    Orders Added: 1)  Est. Patient Level III [27253] 2)  Surgical Referral [Surgery]

## 2010-02-17 NOTE — Consult Note (Signed)
Summary: [facility]   Imported By: Sherian Rein 02/11/2010 12:03:20  _____________________________________________________________________  External Attachment:    Type:   Image     Comment:   External Document

## 2010-02-24 ENCOUNTER — Encounter: Payer: Self-pay | Admitting: Endocrinology

## 2010-02-25 NOTE — Op Note (Signed)
Summary: [facility]   Imported By: Sherian Rein 02/15/2010 14:22:41  _____________________________________________________________________  External Attachment:    Type:   Image     Comment:   External Document

## 2010-03-01 ENCOUNTER — Ambulatory Visit: Payer: Self-pay

## 2010-03-05 ENCOUNTER — Ambulatory Visit: Admitting: Physical Medicine & Rehabilitation

## 2010-03-05 ENCOUNTER — Encounter: Attending: Physical Medicine & Rehabilitation

## 2010-03-05 DIAGNOSIS — M545 Low back pain: Secondary | ICD-10-CM

## 2010-03-05 DIAGNOSIS — M5380 Other specified dorsopathies, site unspecified: Secondary | ICD-10-CM | POA: Insufficient documentation

## 2010-03-05 DIAGNOSIS — R209 Unspecified disturbances of skin sensation: Secondary | ICD-10-CM

## 2010-03-05 DIAGNOSIS — R1011 Right upper quadrant pain: Secondary | ICD-10-CM

## 2010-03-05 DIAGNOSIS — M47817 Spondylosis without myelopathy or radiculopathy, lumbosacral region: Secondary | ICD-10-CM

## 2010-03-08 ENCOUNTER — Other Ambulatory Visit: Payer: Self-pay | Admitting: Physical Medicine & Rehabilitation

## 2010-03-08 DIAGNOSIS — M545 Low back pain: Secondary | ICD-10-CM

## 2010-03-09 NOTE — Letter (Signed)
Summary: Atilano Ina MD  Atilano Ina MD   Imported By: Lester Alpine 03/04/2010 10:51:56  _____________________________________________________________________  External Attachment:    Type:   Image     Comment:   External Document

## 2010-03-15 ENCOUNTER — Ambulatory Visit (HOSPITAL_COMMUNITY)
Admission: RE | Admit: 2010-03-15 | Discharge: 2010-03-15 | Disposition: A | Discharge status: Home / Self Care | Source: Ambulatory Visit | Attending: Physical Medicine & Rehabilitation | Admitting: Physical Medicine & Rehabilitation

## 2010-03-15 DIAGNOSIS — M47817 Spondylosis without myelopathy or radiculopathy, lumbosacral region: Secondary | ICD-10-CM | POA: Insufficient documentation

## 2010-03-15 DIAGNOSIS — M545 Low back pain, unspecified: Secondary | ICD-10-CM | POA: Insufficient documentation

## 2010-03-23 NOTE — Letter (Signed)
Summary: [facility]   Imported By: Sherian Rein 03/18/2010 12:35:05  _____________________________________________________________________  External Attachment:    Type:   Image     Comment:   External Document

## 2010-04-08 ENCOUNTER — Ambulatory Visit (INDEPENDENT_AMBULATORY_CARE_PROVIDER_SITE_OTHER): Admitting: Internal Medicine

## 2010-04-08 ENCOUNTER — Encounter: Payer: Self-pay | Admitting: Internal Medicine

## 2010-04-08 VITALS — BP 118/72 | HR 72 | Temp 98.7°F | Ht 73.0 in | Wt 236.0 lb

## 2010-04-08 DIAGNOSIS — G619 Inflammatory polyneuropathy, unspecified: Secondary | ICD-10-CM

## 2010-04-08 DIAGNOSIS — B029 Zoster without complications: Secondary | ICD-10-CM

## 2010-04-08 MED ORDER — VALACYCLOVIR HCL 1 G PO TABS: 1000.0000 mg | ORAL_TABLET | Freq: Two times a day (BID) | ORAL | Status: AC

## 2010-04-08 NOTE — Progress Notes (Signed)
Subjective:     Connor Holt is a 45 y.o. male who presents for evaluation of a rash involving the left scalp. Rash started 2 days ago. Lesions are clear, and blistering in texture. Rash has not changed over time. Rash is painful. Associated symptoms: fatigue. Patient denies: abdominal pain, arthralgia, congestion, cough, decrease in appetite, fever, headache, irritability, myalgia, nausea, sore throat and vomiting. Patient has not had contacts with similar rash. Patient has not had new exposures (soaps, lotions, laundry detergents, foods, medications, plants, insects or animals).  The following portions of the patient's history were reviewed and updated as appropriate: allergies, current medications, past family history, past medical history, past social history, past surgical history and problem list.  Review of Systems Constitutional: positive for fatigue, negative for anorexia, chills, fevers, malaise, night sweats, sweats and weight loss    Objective:    BP 118/72  Pulse 72  Temp(Src) 98.7 F (37.1 C) (Oral)  Ht 6\' 1"  (1.854 m)  Wt 236 lb (107.049 kg)  BMI 31.14 kg/m2  SpO2 99% General:  alert, cooperative and no distress  Skin:  vesicles noted on top of scalp, left side, no cross over the midline, there are a group of 5 excoriated vesicles, no pus/induration/erythema/warmth     Assessment:    varicella    Plan:    Medications: antibiotics: valtrex. Written patient instruction given. Follow up in 7 days.

## 2010-04-08 NOTE — Patient Instructions (Signed)
Shingles (Herpes Zoster)  Shingles is caused by the same virus that causes chicken pox (varicella zoster virus or VZV). Shingles often occurs many years or decades after having chicken pox. That is why it is more common in adults older than 50 years. The virus reactivates and breaks out as an infection in a nerve root.  SYMPTOMS   The initial feeling (sensations) may be pain. This pain is usually described as:    Burning.    Stabbing.    Throbbing.    Tingling in the nerve root.    A red rash will follow in a couple days. The rash may occur in any area of the body and is usually on one side (unilateral) of the body in a band or belt-like pattern. The rash usually starts out as very small blisters (vesicles). They will dry up after 7 to 10 days. This is not usually a significant problem except for the pain it causes.    Long lasting (chronic) pain is more likely in an elderly person. It can last months to years. This condition is called post-herpetic neuralgia.   Shingles can be an extremely severe infection in someone with AIDS, a weakened immune system or with forms of leukemia. It can also be severe if you are taking transplant medications or other medications that weaken the immune system.  TREATMENT  Your caregiver will often treat you with:   Antiviral drugs.    Anti-inflammatory drugs.    Pain medications.    Bed rest is very important in preventing the pain associated with herpes zoster (post-herpetic neuralgia).    Application of heat in the form of a hot-water bottle or electric heating pad or gentle pressure with the hand is recommended to help with the pain or discomfort.   PREVENTION  A varicella zoster vaccine is available to help protect against the virus. The Food and Drug Administration approved the varicella zoster vaccine for individuals 50 years of age and older.  HOME CARE INSTRUCTIONS   Cool compresses to the area of rash may be helpful.    Only take over-the-counter or  prescription medicines for pain, discomfort or fever as directed by your caregiver.    Avoid contact with:    Babies.    Pregnant women.    Children with eczema.    Elderly people with transplants.    People with chronic illnesses, such as leukemia and AIDS.    If the area involved is on your face, you may receive a referral for follow-up to a specialist. It is very important to keep all follow-up appointments. This will help avoid eye complications, chronic pain or disability.   SEEK IMMEDIATE MEDICAL CARE IF:   You develop any pain (headache) in the area of the face or eye. This must be followed carefully by your caregiver or ophthalmologist. An infection in part of your eye (cornea) can be very serious. It could lead to blindness.    You do not have pain relief from prescribed medications.    The redness or swelling spreads.    The area involved becomes very swollen and painful.    You have an oral temperature above 102 F (38 C), not controlled by medicine.    You notice any red or painful lines extending away from the affected area toward your heart (lymphangitis).    Your condition is worsening or has changed.   Document Released: 12/27/2004 Document Re-Released: 06/16/2009  [employer] Patient Information 2011 [employer].

## 2010-04-08 NOTE — Assessment & Plan Note (Signed)
Continue pain relief with ultram and neurontin

## 2010-04-09 ENCOUNTER — Encounter: Attending: Physical Medicine & Rehabilitation

## 2010-04-09 ENCOUNTER — Ambulatory Visit: Admitting: Physical Medicine & Rehabilitation

## 2010-04-09 ENCOUNTER — Telehealth: Payer: Self-pay | Admitting: Endocrinology

## 2010-04-09 DIAGNOSIS — M5380 Other specified dorsopathies, site unspecified: Secondary | ICD-10-CM | POA: Insufficient documentation

## 2010-04-09 DIAGNOSIS — B029 Zoster without complications: Secondary | ICD-10-CM

## 2010-04-09 DIAGNOSIS — G894 Chronic pain syndrome: Secondary | ICD-10-CM

## 2010-04-09 MED ORDER — HYDROCODONE-ACETAMINOPHEN 5-500 MG PO TABS: 2.0000 | ORAL_TABLET | Freq: Four times a day (QID) | ORAL | Status: AC | PRN

## 2010-04-09 NOTE — Telephone Encounter (Signed)
Pt states that he was Dx w/shingles on scalp yesterday during OV and was told to call office if he needed Rx for pain. Pt c/o great pain & burning on head and would like Rx for pain sent in to pharmacy.  Called Pt back and verified correct pharmacy: [facility] @ [phone]

## 2010-04-09 NOTE — Telephone Encounter (Signed)
RX called in, Patient informed

## 2010-04-09 NOTE — Telephone Encounter (Signed)
Done, please call it in

## 2010-05-14 ENCOUNTER — Ambulatory Visit: Admitting: Physical Medicine & Rehabilitation

## 2010-05-20 ENCOUNTER — Encounter: Attending: Physical Medicine & Rehabilitation

## 2010-05-20 ENCOUNTER — Ambulatory Visit: Admitting: Physical Medicine & Rehabilitation

## 2010-05-20 DIAGNOSIS — M25559 Pain in unspecified hip: Secondary | ICD-10-CM

## 2010-05-20 DIAGNOSIS — M5380 Other specified dorsopathies, site unspecified: Secondary | ICD-10-CM | POA: Insufficient documentation

## 2010-05-21 ENCOUNTER — Other Ambulatory Visit: Payer: Self-pay | Admitting: Physical Medicine & Rehabilitation

## 2010-05-21 DIAGNOSIS — M25559 Pain in unspecified hip: Secondary | ICD-10-CM

## 2010-05-21 NOTE — Procedures (Signed)
Connor Holt, Connor Holt          ACCOUNT NO.:  [id number]  MEDICAL RECORD NO.:  [id number]           PATIENT TYPE:  O  LOCATION:  [facility]                          FACILITY:  [facility]  PHYSICIAN:  Erick Colace, M.D.DATE OF BIRTH:  10-23-1965  DATE OF PROCEDURE:  05/20/2010 DATE OF DISCHARGE:                              OPERATIVE REPORT  PROCEDURE:  Right inguinal ultrasound for evaluation of pain in the right groin area radiating into the scrotal area as well as to the anterior thigh.  Pain is only partially responsive to medication management.  The patient was placed in a supine position.  Ultrasound gel was applied to the right inguinal area.  Femoral artery identified, confirmed with Doppler imaging, scanned superior and inferior from the inguinal area. There was no evidence of any neuromas.  I could not adequately visualize the lesser trochanter or any of the iliopsoas attachments.  No soft tissue masses identified.  Discussed results with the patient.  We will proceed with MRI of the right hip.     Erick Colace, M.D. Electronically Signed    AEK/MEDQ  D:  05/20/2010 14:46:03  T:  05/21/2010 00:44:39  Job:  [id number]

## 2010-05-24 ENCOUNTER — Ambulatory Visit (HOSPITAL_COMMUNITY)
Admission: RE | Admit: 2010-05-24 | Discharge: 2010-05-24 | Disposition: A | Discharge status: Home / Self Care | Source: Ambulatory Visit | Attending: Physical Medicine & Rehabilitation | Admitting: Physical Medicine & Rehabilitation

## 2010-05-24 DIAGNOSIS — M25559 Pain in unspecified hip: Secondary | ICD-10-CM

## 2010-05-24 DIAGNOSIS — R109 Unspecified abdominal pain: Secondary | ICD-10-CM | POA: Insufficient documentation

## 2010-05-24 DIAGNOSIS — M79609 Pain in unspecified limb: Secondary | ICD-10-CM | POA: Insufficient documentation

## 2010-05-25 NOTE — Assessment & Plan Note (Signed)
Connor Holt returns today.  He has had bilateral sacroiliac injections  performed about a month ago.  While he had reduction of pain,  preinjection and postinjection, of greater than 50%, he states that the  duration of the pain relief was not as long as the prior injection,  which was about 2 weeks.  He went from a 5/10 to a 2/10 on September 06, 2007, whereas on July 25, 2007, he went from a 6 to a 3 or 4, but this  lasted longer.   He has had no new medical problems in the last month.  He is on Ultram  ER 300 mg a day.  He is also receiving oxycodone through the [place].  I have  not been prescribing this.   His Oswestry disability questionnaire was completed today.  His score is  40%.   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   His average pain is 5-6/10.  He is worse with bending, sitting, and  standing; improves with rest, medications, and injections.   His review of system is positive for spasms and anxiety.   His blood pressure is 129/92, pulse 100, respiratory rate 18, and O2 sat  97% on room air.  Generally, in no acute distress.  Orientation x3.  Affect is alert.  Gait is normal.   Extremities without edema.  Coordination is normal.  Deep tendon  reflexes normal.   Lower extremity strength is normal.  His back range of motion is good in  forward flexion, limited in extension although he does get to about 50%  of normal extension.  His gait is normal.   IMPRESSION:  1. Lumbar spondylosis without myelopathy.  This is his primary pain      generator.  2. Sacroiliac disorder.  I think this is a secondary pain generator      and he did get some significant but short-term relief with his      sacroiliac injections.  As I discussed with the patient, he would      be a candidate for sacroiliac radiofrequency ablation; however,      more difficult to predict the result and duration of effect given      variability and  innervation of that structure.   He will see me back in 3 months.  I advised continued exercise, walking,  as well as lumbar flexion exercises.  His work schedule is such that it  is difficult to do much outside of his work hours.   He will call if his pain worsens in the interval time period to set up  either repeat lumbar RF or possibly a sacroiliac or depending on  symptomatology.      Erick Colace, M.D.  Electronically Signed     AEK/MedQ  D:  10/08/2007 13:44:36  T:  10/09/2007 04:45:17  Job #:  [id number]

## 2010-05-25 NOTE — Assessment & Plan Note (Signed)
HISTORY:  Mr. Dufresne returns today.  He had a sacroiliac injection done  on June 25, 2007.  He had almost complete relief starting around 10  minutes after an injection.  This lasted for about 2 weeks, then pain  gradually returned.  He noticed after getting right-side injection  similarly in the left side.  Became more apparent in terms of pain.  His  pain was up to about 4/10, interferes with activity at a moderate level.  Pain is described as burning, intermittent, aching, increased with  bending forward, standing for long period of time, improves with rest,  heat, medications, and injections.   He can walk 15 minutes at a time, climb steps, and drives.  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   PHYSICAL EXAMINATION:  VITAL SIGNS:  His blood pressure 130/92, pulse  86, respiratory rate 20, and O2 sat 97% on room air.  GENERAL:  No acute distress.  Orientation x3. Affect is alert.  Gait,  mildly forward flexed.  EXTREMITIES:  His extremity without edema.  He has tenderness in the  lumbar spine actually PSIS up to L5 bilaterally.  His range of motion of  lumbar spine is 50% forward flexion, extension, lateral rotation, and  bending.   IMPRESSION:  1. Lumbar facet syndrome status post radiofrequency ablation.  2. Sacroiliac injection with improved low back buttock pain.  We will      set him up for sacroiliac injection, repeat given 20% false-      positive rate.  Consider radiofrequency to sacroiliac.      Erick Colace, M.D.  Electronically Signed     AEK/MedQ  D:  07/24/2007 08:46:22  T:  07/24/2007 23:26:32  Job #:  [id number]

## 2010-05-25 NOTE — Assessment & Plan Note (Signed)
Mr. Fendley returns today.  I last saw him April 13, 2006.  He has been  filling his oxycodone 5/325 two p.o. t.i.d.  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  He continues with his home  stretching exercises.  He feels that overall his activity has been  increasing, and that he has been doing about as well as he ever has in  recent time.   He is inquiring about reducing his oxycodone, and whether his Ultram ER  can be increased as a part of this.  His average pain is 3 to 4 out of  10.  Relief from medication is good.  He can walk 15 minutes plus at a  time, climb steps.  He drives.  He works 50 hours a week, driving a lot.  He has depression and anxiety, but no suicidal thoughts.   EXAMINATION:  Blood pressure 118/79.  Pulse 95.  Respirations 18.  His  O2 saturation is 97% on room air.  GENERAL:  No acute distress.  Mood and affect appropriate.  BACK:  No tenderness to palpation, except at the lumbosacral junction.  He has pain with extension greater than flexion.  LOWER EXTREMITIES:  Strength is good.  Range of motion is good.  Gait shows no evidence of toe drag or instability.   IMPRESSION:  Lumbar facet syndrome, status post lumbar radiofrequency  procedure approximately 1 year ago.  Still doing well with this.   PLAN:  We will increase his Ultram ER to 300 mg q.24 hours in  anticipation that his oxycodone will be reduced to 1 tablet 3 times a  day.      Erick Colace, M.D.  Electronically Signed     AEK/MedQ  D:  08/28/2006 17:32:32  T:  08/29/2006 10:55:10  Job #:  [id number]   cc:   Dr. Alphonsa Gin  Fax number:  [phone]

## 2010-05-25 NOTE — Assessment & Plan Note (Signed)
Connor Holt returns today.  He had bilateral sacroiliac injections  performed in August 2009.  He had reduction pain of greater than 50%  relief.  He continues to have pain at the lumbosacral junction.  He has  had no new medical problems in interval time.   MEDICATIONS:  His pain medicines are Ultram ER 300 mg per day and  oxycodone which he receives through the [place], takes about 6 tablets of  the10 mg per day.  This is prescribed through the [place].   His average pain is in the 4-5 range.  Intermittent burning, aching, and  interferes with activity at a moderate level.  Pain improves with rest,  heat, therapy, medications and injections, worse with bending, sitting,  and standing.  He can walk 15 minutes at a time, climb steps, and  drives.  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  He has been doing  some hunting as well.   REVIEW OF SYSTEMS:  Positive for spasms, anxiety, and weight gain.  Oswestry disability index score 46%.   PHYSICAL EXAMINATION:  VITAL SIGNS:  Blood pressure 146/74, pulse 85,  respirations 18, and O2 sat 97% on room air.  GENERAL:  A well-developed, well-nourished male in no acute distress.  Orientation x3.  Affect is alert.  Gait is normal.   He has tenderness at the lumbosacral junction also at the PSIS.  Pain  with extension greater than with flexion.  He has full lower extremity  strength or normal deep tendon reflex in bilateral extremities.  Normal  range of motion at the hips, knees, and ankles.   IMPRESSION:  Lumbosacral pain.  I believe he has this combination of  lumbar facet syndrome as well as a sacroiliac disorder.  We would need  to diagnostic differential injections.  He may benefit from medial  branch blocks from both diagnostic and therapeutic standpoint followed  by repeat radiofrequency.  I discussed with the patient, he wishes to  hold off on this.  He will call should he want to proceed.  I will see  him back in 3 months.      Erick Colace, M.D.  Electronically Signed     AEK/MedQ  D:  12/31/2007 10:03:13  T:  01/01/2008 01:06:18  Job #:  [id number]

## 2010-05-25 NOTE — Assessment & Plan Note (Signed)
Mr. Grafton returns today.  He has had an increase in low back pain last  month but today his bigger complaint is onset of right parascapular pain  radiating into the ribs.  He feels a tightness underneath the  shoulderblade as well as a tingling sensation.  He rented some heavy  equipment that he had to push in his yard quite a bit over the weekend,  and pain did start by the next morning after doing this.  He has had no  significant neck pain.  He has had no arm weakness.  His average pain is  7 out of 10 described as intermittent pain and aching and occurs with  activity at 6 out of 10 level .  The pain is rather consistent  throughout the day.  Relief with meds in fair.  He can walk 15 to 20  minutes at a time.  He can climb steps.  He drives.  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  He needs some assistance with household  duties.   REVIEW OF SYSTEMS:  Positive for numbness, tingling and spasms.   PHYSICAL EXAMINATION:  VITAL SIGNS:  Blood pressure 145/85, pulse 84,  respirations 18.  O2 sat 93% on room air.  GENERAL:  No acute distress.  Mood and affect appropriate.  MUSCULOSKELETAL:  His back has tenderness at the inferior angle of his  scapula.  He has pain that continues on to the posterior axillary fold  and along to the right-sided ribs.  He has some pain as he pushes  against the door.  He has no pain with hand behind the back pushing  upward.  He has negative impingement sign, negative Spurling's maneuver.  Neck has full range of motion without pain.  He has no paraspinal  tenderness in the lumbar or in the thoracic spine area.  Upper extremity  strength is normal.  Lower extremity strength is normal.   IMPRESSION:  Serratus anterior strain.  Parascapular myofascial pain.  Will treat conservatively, reassurance that this is a muscular injury,  and should subside in one or two weeks' time.  He can take over-the-  counter or nonsteroidals.  He can use heat or ice  and a muscle rub  cream.   I will see him back in about a month's time to see how he is doing.   Addendum:  He has a secondary complaint today, too, which is  overshadowed by the first.  It is his low back, actually the buttocks  area which has persisted despite having improvement in his low back pain  following radio frequency neurotomy.  He points in the PSIS area.  He  does have tenderness to palpation over that area.  He has normal hip  range of motion, normal lower extremity strength.  I suspect that this  is a sacroiliac problem and the only way to confirm that would be to do  intra-articular injection.  We will set him up for this and further  treatment depending on results.      Erick Colace, M.D.  Electronically Signed     AEK/MedQ  D:  05/28/2007 16:36:36  T:  05/28/2007 18:21:40  Job #:  [id number]

## 2010-05-25 NOTE — Assessment & Plan Note (Signed)
Connor Holt returns today to follow up on EMG results, he has had improvement  in lower extremity symptoms of burning and numbness with increase in  Neurontin to 400 t.i.d.   His back pain is relatively well-controlled, no significant increase or  changes in that respect.  He is status post lumbar radiofrequency  neurotomy September of 2007.   He has had no other new medical problems.  He has had some difficulty  with his diabetic management.   His average pain is in the 3-4 range in the back and posterior thigh.  His hamstring pain has been improved since starting Flexeril.   EXAMINATION:  GENERAL:  In no acute distress, mood and affect  appropriate.  BACK:  Mild tenderness to palpation lumbar paraspinals.  He has good  forward flexion and extension.  Deep tendon reflexes are normal at the knees and ankles.   Review of his nerve conduction studies reveals normal peroneal motor  nerve, normal needle EMG study but absent right superficial peroneal  sensory and bilateral sural sensory nerves.   IMPRESSION:  1. 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  This can be seen in earlier      stages of diabetic neuropathy.  2. Chronic low back pain due to lumbar facet syndrome, no need for      repeat radiofrequency, neurotomy is planned.  Will continue on      current dose of Neurontin 400 t.i.d.  3. Continue cyclobenzaprine 5 t.i.d.  4. Continue Ultram ER 300 daily.   I will see him back in 2 months.      Erick Colace, M.D.  Electronically Signed     AEK/MedQ  D:  11/30/2006 17:18:49  T:  12/01/2006 11:06:58  Job #:  [id number]   cc:   Dr. ______

## 2010-05-25 NOTE — Procedures (Signed)
NAMEJERMOND, Connor Holt          ACCOUNT NO.:  [id number]   MEDICAL RECORD NO.:  [id number]          PATIENT TYPE:  REC   LOCATION:  [facility]                          FACILITY:  [facility]   PHYSICIAN:  Erick Colace, M.D.DATE OF BIRTH:  [birth year]   DATE OF PROCEDURE:  03/05/2007  DATE OF DISCHARGE:                               OPERATIVE REPORT   PROCEDURE:  Right L5 dorsal ramus injection and radiofrequency  neurotomy, right L4 medial branch block and radiofrequency neurotomy,  right L3 medial branch block radiofrequency neurotomy.   INDICATIONS:  Lumbar facet mediated pain, has had good response to  lumbar radiofrequency neurotomy, last done in 2007.  His pain is only  partially responsive to medication management including narcotic  analgesics and interferes with dressing and household duties.  Pain at a  6/10 level.   Informed consent was obtained after describing risks and benefits of the  procedure to the patient.  These include bleeding, bruising, infection,  loss of bowel and bladder function, temporary or permanent paralysis as  well as, in his case given he is diabetic, the steroid may increase his  blood sugars for the next 24-72 hours.  He elects to proceed and has  given written consent.  The patient placed prone on fluoroscopy table.  Betadine prep, sterile drape, a 25-gauge inch and a half needle was used  to anesthetize skin and subcutaneous tissue with 1% lidocaine x2 mL,  then a 20-gauge RF needle 10 cm with the 10 mm curved active tip was  inserted under fluoroscopic guidance first targeting the left right S1  SAP sacral ala junction, bone contact made, confirmed lateral imaging.  Sensory stim at 50 Hz followed by motor stim at 2 Hz confirmed proper  needle location, followed by injection of 1 mL of a solution containing  1 mL of 4 mg per mL dexamethasone and 2 mL of 1% MPF lidocaine, followed  by radiofrequency lesioning 70 degrees Celsius for 70 seconds.  Then  the  right L5 SAP transverse process junction targeted, bone contact made,  confirmed with lateral imaging.  Sensory stimulation at 50 Hz, followed  by motor stimulation 2 Hz demonstrated proper needle location, followed  by injection of 1 mL of dexamethasone lidocaine solution and  radiofrequency lesioning 70 degrees for 70 seconds.  Then the right L4  SAP transverse process junction targeted, bone contact made, confirmed  with lateral imaging.  Sensory stim at 50 Hz, followed by motor stim at  2 Hz confirmed proper needle location, followed by injection of 1 mL of  dexamethasone lidocaine solution and radiofrequency lesioning 70 degrees  for 70 seconds.  The patient tolerated the procedure well.  Pre- and  post injection vitals stable.  Post injection instructions given.  I  will see him back in one month for radiofrequency on the left side.      Erick Colace, M.D.  Electronically Signed     AEK/MEDQ  D:  03/05/2007 10:16:23  T:  03/05/2007 15:13:51  Job:  [id number]

## 2010-05-25 NOTE — Procedures (Signed)
NAMEANASTACIO, Connor Holt          ACCOUNT NO.:  [id number]   MEDICAL RECORD NO.:  [id number]          PATIENT TYPE:  REC   LOCATION:  [facility]                          FACILITY:  [facility]   PHYSICIAN:  Erick Colace, M.D.DATE OF BIRTH:  1965/06/19   DATE OF PROCEDURE:  06/25/2007  DATE OF DISCHARGE:                               OPERATIVE REPORT   A right sacroiliac injection under fluoroscopic guidance.   INDICATION:  Right lower back buttock pain, only partially relieved by  medication management including narcotic, analgesics, and interfering  with self-care mobility.   Informed consent was obtained after describing the risks and benefits of  the procedure with the patient.  These include bleeding, bruising, and  infections as well as elevation of blood sugar and given that he is  diabetic.   He elects to proceed and has given written consent.  The patient placed  prone on the fluoroscopy table.  Betadine prep and sterile drape.  A 25-  gauge 1-1/2-inch needle was used to anesthetize the skin and  subcutaneous tissue with 1% lidocaine x2 mL, then a 25-gauge 3-1/2-inch  spinal needle was inserted in the right SI joint.  AP, lateral, and  oblique imaging.  Omnipaque 180 x 0.5 mL demonstrated good joint outline  followed by injection of 1 mL of 2% MPF lidocaine with __________ Depo-  Medrol.  The patient tolerated the procedure well.  Pre-and post-  injection vitals are stable.  Post-injection instructions were given.  Pre-injection pain level 6/10 and post-injection 3-4/10. Consider  reinjection in 1 month.      Erick Colace, M.D.  Electronically Signed     AEK/MEDQ  D:  06/25/2007 15:13:19  T:  06/26/2007 05:27:50  Job:  [id number]

## 2010-05-25 NOTE — Assessment & Plan Note (Signed)
FOLLOW-UP VISIT:  The patient returns today, and 08/28/2006 was last  visit.  He has had increasing hamstring pain.  This has interfered with  some of his overall activity.  He is also having some tingling and  numbness in his hands and feet, which he worries may be diabetic  neuropathy.   PHYSICAL EXAMINATION:  GENERAL:  In no acute distress.  Mood and affect  are appropriate.  VITAL SIGNS:  His blood pressure is 151/87, pulse of 102, respirations  18, oxygen saturation 97% on room air.  EXTREMITIES:  His motor strength is 5/5 bilaterally in the upper and  lower extremities.  Neck range of motion is good.  Back forward flexion  and extension are normal, but he has pain with extension.  He has mildly  reduced sensation in the feet compared to the knees, but intact in the  hands.  Fine filament testing, as well as light touch were intact  otherwise.   IMPRESSION:  Lumbar facet syndrome.  He may be having some recurrence of  this after one year status post medial branch radiofrequency neurotomy.   PLAN:  I will see him back for an EMG, as well as repeat medial branch  blocks.      Erick Colace, M.D.  Electronically Signed     AEK/MedQ  D:  10/09/2006 11:18:23  T:  10/09/2006 12:22:18  Job #:  [id number]

## 2010-05-25 NOTE — Assessment & Plan Note (Signed)
Connor Holt returns today.  I last saw him April 05, 2007 for a left L5  dorsal ramus injection, radiofrequency neurotomy; left L4 medial branch  block, radiofrequency neurotomy; left L3 medial branch block,  radiofrequency neurotomy under fluoroscopic guidance.  He has had  benefit with the procedure.  He has 2 main complaints today, one is pain  in his low back when he is slightly bent forward at the hips but  improves with upright walking or with sitting.  The other complaint is a  crawling-type sensation that feels like a TENS unit in his upper back  area.  This is not as painful but more annoying.   His average pain is a 4/10, currently a 2/10.  Pain is described as  burning, dull, intermittent, aching.   He can walk 15-20 minutes at a time.  He drives.  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   CURRENT PAIN MEDICATIONS:  He is on Ultram ER 300 mg daily.  [facility]  He takes about 3 tablets per day of 10/325 mg.   PHYSICAL EXAMINATION:  VITAL SIGNS:  Blood pressure 128/81, pulse 89,  respirations 18, O2 saturation 97% on room air.  NEUROLOGIC:  Mood and affect are appropriate.  Orientation x3.  Gait is  normal.  His motor strength is 5/5 bilateral deltoid, biceps, triceps,  grip, as well as hip flexion, knee extension, ankle dorsiflexion.  He  has normal internal and external rotation of the hips.  He has normal  knee and ankle range of motion.  Normal shoulder range of motion.  Normal elbow range of motion.  His sensation is reduced in his feet  mildly, L5-S1 dermatomes, but not in the hands.  He has no pinprick  sensory loss in the thoracic or dorsal ramus dermatomes.  He has no skin  fasciculations, no dermatomal rash over the trunk area.  Motor strength  is normal.  Deep tendon reflexes are normal.   IMPRESSION:  Lumbar pain.  He has had a history of lumbar annular tear.  I  question if he has recurrence of same.  He also has lower extremity  paresthesias.  This could be due to a neuropathy, although it is really  minimal on electromyography versus some radicular symptoms.  Has not had  an MRI for a number of years.  Will order one and see him back to go  over this in 1 month.      Erick Colace, M.D.  Electronically Signed     AEK/MedQ  D:  05/08/2007 15:52:34  T:  05/08/2007 17:48:11  Job #:  [id number]

## 2010-05-25 NOTE — Assessment & Plan Note (Signed)
Mr. Sermon returns today. He was last seen by me on 11/30/06.  He has a  history of lumbar facet arthropathy, previous good result with  radiofrequency neurotomy.  He has had interval history of left knee  swelling, no trauma, no abnormal activities that he notes.  Has not had  any other joint swelling.  He does have some pain of his left ankle.  He  had a fracture a couple years ago.  No hand swelling.   His diabetic management has been fine.  He has had some burning pain in  his feet from his diabetic neuropathy, mononeuritis, multiplex pattern.   His average pain is 5/10, describes pain, intermittent dull aching and  appears with activity at a moderate level.  Pain improves with rest,  heat, medications, TENS, as well as injections.  Worse with prolonged  sitting and inactivity as well as prolonged standing.  He can drive.  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  He has some difficulty with  certain household duties such as bending forward washing dishes for a  long period of time.   SOCIAL HISTORY:  Married, works full time, one child.   PHYSICAL EXAMINATION:  VITALS:  Blood pressure 130/77, pulse 104,  respirations 18, O2 sat 94%.  GENERAL:  Overweight male in no acute distress.  Mood and affect are  appropriate.  His back is tenderness to palpation at lumbosacral  junction in the paraspinal muscles.  He has pain with extension of  lumbar spine but not with flexion.  He has normal strength in the  __________ grip as well as hip flexor, knee extensor, ankle dorsiflexor.  Deep tendon reflexes are normal at the knees, ankles, biceps, triceps,  brachial radialis.   Knee shows no obvious swelling, no crepitus.  No erythema.  He has some  tenderness over the answering bursar on the left side only.  Hip range  of motion, knee and ankle range of motion are normal with the exception  of some end range flexion deficit on the left side only.   IMPRESSION:  Lumbar facet arthropathy  with recurrence of pain.  He is  over one year post radiofrequency neurotomy and as expected it is  wearing off.  Will schedule for repeat radiofrequency neurotomy starting  on the right side.  In terms of his knee pain he will follow-up with  [facility] orthopedics on that.  No signs of a more generalized  arthritis.  He reportedly did have an MRI showing no cartilage problems  per his report although I do not have a copy.  He says that he is too  busy to go through physical therapy at this time.   Diabetic neuropathy.  Continue Neurontin 400 t.i.d.      Erick Colace, M.D.  Electronically Signed     AEK/MedQ  D:  01/30/2007 16:04:37  T:  01/30/2007 19:00:46  Job #:  [id number]   cc:   Rodolph Bong, M.D.  Fax: [phone]   Corky Sing

## 2010-05-25 NOTE — Procedures (Signed)
NAMEDIETRICH, Connor Holt          ACCOUNT NO.:  [id number]   MEDICAL RECORD NO.:  [id number]         PATIENT TYPE:  hrec   LOCATION:                                 FACILITY:   PHYSICIAN:  Erick Colace, M.D.DATE OF BIRTH:  [birth year]   DATE OF PROCEDURE:  DATE OF DISCHARGE:                               OPERATIVE REPORT   PROCEDURE:  Bilateral sacroiliac injection under fluoroscopic guidance.   INDICATIONS:  Sacroiliac disorder.  He had good pain relief after a  right sacroiliac injection performed on June 25, 2007, he had a couple  of week relief.  He has pain really in same area on both spots.  Pain  has only partially responded to medication management including narcotic  analgesics medications and tramadol.   Pain interferes with mobility and activities of daily living including  yard work.   PROCEDURE IN DETAIL:  Informed consent was obtained after describing the  risks and benefits of the procedure with the patient.  These include  bleeding, bruising, and infection.  He elects to proceed and has given  written consent.  The patient was placed prone on fluoroscopy table.  Betadine prepped and sterile draped.  A 25-gauge 1-1/2-inch needle was  used to anesthetize the skin and subcu tissue with 1% lidocaine x2 mL  and a 25-gauge 3-inch spinal needle was inserted under fluoroscopic  guidance targeting the left SI joint, AP, lateral, and oblique imaging  utilized.  Omnipaque 180 under live fluoro demonstrated good arthrogram  and no evidence of intravascular uptake.  Then a solution containing 1  mL of 2% MPF lidocaine and one-half mL of 40 mg/mL Depo-Medrol was  injected.  The same procedure was repeated on the right side using the  same needle injectate and technique.  The patient tolerated the  procedure well.  Pre and post-injection and vitals stable.  Post-  injection instructions were given.      Erick Colace, M.D.  Electronically Signed     AEK/MEDQ  D:  09/06/2007 13:08:28  T:  09/07/2007 04:22:49  Job:  [id number]

## 2010-05-25 NOTE — Procedures (Signed)
NAMEAREEB, Connor Holt          ACCOUNT NO.:  [id number]   MEDICAL RECORD NO.:  [id number]          PATIENT TYPE:  REC   LOCATION:  [facility]                          FACILITY:  [facility]   PHYSICIAN:  Erick Colace, M.D.DATE OF BIRTH:  1965-08-26   DATE OF PROCEDURE:  04/05/2007  DATE OF DISCHARGE:                               OPERATIVE REPORT   PROCEDURE:  Left L5 dorsal ramus injection and radiofrequency neurotomy,  left L4 medial branch block and radiofrequency neurotomy, left L3 medial  branch block and radiofrequency neurotomy under fluoroscopic guidance.   INDICATION:  Lumbar facet syndrome.  He has had good relief with  radiofrequency neurotomy in the past.  The last one was done in 2007.  His pain is only partially responsive to medication management including  narcotic analgesics and interferes with dressing and household duties.  He had the right side done last month with reduction in right-sided low  back pain.  He is here for the left side.   Informed consent was obtained after describing risks and benefits of the  procedure to the patient.  These include bleeding, bruising, infection,  loss of bowel and bladder function, temporary or permanent paralysis as  well as, in his case, he is diabetic and steroid may increase his blood  sugars for the next 24-72 hours.  He elects to proceed and has given  written consent.  The patient placed prone on fluoroscopy table.  Betadine prep, sterile drape, a 25-gauge inch and a half needle was used  to anesthetize the skin and subcutaneous tissue with 1% lidocaine x2 mL  and a 20-gauge RF needle with 10 cm curved active tip, 10 cm length, was  inserted under fluoroscopic guidance first targeting the left S1 SAP  sacral ala junction.  Bone contact made and confirmed with lateral  imaging.  Sensory stim at 50 Hz, followed by motor stim at 2 Hz  confirmed proper needle location followed by injection of 1 mL of a  solution containing 1  mL of 4 mg/mL dexamethasone and 2 mL of 1% MPF  lidocaine, followed by radiofrequency lesioning 70 degrees Celsius for  70 seconds.  Then the left L5 SAP transverse process junction targeted,  bone contact made, confirmed lateral imaging.  Sensory stim at 50 Hz,  followed by motor stim at 2 Hz confirmed proper needle location,  followed by injection 1 mL of the dexamethasone/lidocaine solution and  radiofrequency lesioning 70 degrees Celsius for 70 seconds.  Then the  left L4 SAP transverse process junction targeted, bone contact made,  confirmed with lateral imaging.  Sensory stim at 50 Hz, followed by  motor stim at 2 Hz confirmed proper needle location followed by  injection of 1 mL of the dexamethasone/lidocaine solution and  radiofrequency lesioning 70 degrees Celsius for 70 seconds.  The patient  tolerated the procedure well.  Pre- and post injection vitals stable.  Post injection instructions given. I will see him back in one month for  follow-up visit.  Post injection pain level 0/10.      Erick Colace, M.D.  Electronically Signed     AEK/MEDQ  D:  04/05/2007 10:25:14  T:  04/05/2007 15:29:42  Job:  [id number]

## 2010-05-28 NOTE — Procedures (Signed)
NAMESYRUS, Connor Holt          ACCOUNT NO.:  [id number]   MEDICAL RECORD NO.:  [id number]          PATIENT TYPE:  REC   LOCATION:  [facility]                          FACILITY:  [facility]   PHYSICIAN:  Erick Colace, M.D.DATE OF BIRTH:  07-14-1965   DATE OF PROCEDURE:  10/06/2005  DATE OF DISCHARGE:                                 OPERATIVE REPORT   PROCEDURE:  Right lumbar L5 dorsal ramus injection and radiofrequency  neurotomy, right L4 medial branch block and radiofrequency neurotomy, right  L3 medial block and radiofrequency neurotomy.   INDICATIONS FOR PROCEDURE:  Lumbar facet mediated pain, prior radiofrequency  produced 9 months of pain relief on the right and 10 months on the left. His  repeat radiofrequency on the left has resulted in pain relief.   Informed consent was obtained after describing the risks and benefits of the  procedure to the patient. These include bleeding, bruising, infection, loss  of bowel and bladder function, temporary or permanent paralysis. He elects  to proceed and has given written consent.   The patient placed prone on the fluoroscopy table, Betadine prepped, sterile  drape, a  25 gauge 1.5 inch needle was used to anesthetize the skin and  subcu tissue. 1% lidocaine x2 mL at each of 3 sites and a 20-gauge 10 cm RF  needle with 10 mm tip was inserted under fluoroscopic guidance. First at the  right S1 SAP sacroiliac junction, bone contact made and confirmed with  lateral imaging. Motor strength 3 volts demonstrated no lower extremity  twitches followed by injection of 1 mL of a solution containing 1 mL of 40  mg per mL Depo-Medrol and 2 mL of 1% lidocaine. This was followed by  radiofrequency lesioning 70 degrees x 70 seconds. Next, the right L5 SAP  transverse process junction targeted. Bone contact made and confirmed with  lateral imaging. Motor stim 3 volts demonstrated no lower extremity twitches  followed by injection of 1 mL of Depo-Medrol  lidocaine solution followed by  RF lesioning 70 degrees x 70 seconds and then right L4 SAP transverse  process junction targeted and bone contact made, confirmed with lateral  imaging. A motor stem at 3 volts demonstrated no lower extremity twitches  followed by lesioning 70 degrees x 70 second. The patient tolerated the  procedure well. Post procedure instructions given and post injection vitals  stable. He is to return in 6 weeks for followup appointment.      Erick Colace, M.D.  Electronically Signed     AEK/MEDQ  D:  10/06/2005 17:21:16  T:  10/08/2005 16:45:33  Job:  [id number]

## 2010-05-28 NOTE — Assessment & Plan Note (Signed)
MEDICAL RECORD NUMBER:  [id number].   DATE OF BIRTH:  02-11-1965.   The patient returns today after I last saw him May 29, 2003. He underwent  bilateral L3-4 medial branch blocks as well as bilateral L5 dorsal ramus  injections under fluoroscopic guidance. The patient had 1 to 2 days relief  of his typical low back pain and left thigh pain. He chickened out in  terms of scheduling the lumbar radiofrequency neurotomy. He did start  exercising more and has followed up with psychology and feels like that has  benefited him a great deal. He was placed on Xanax at the suggestion of the  psychologist in conjunction with his primary care physician and feels like  this has benefited him, although he is still on it, he wants to come off of  it. He continues to take his Percocet. He has recently reduced his dose from  10 tablets to 8 tablets a day. [facility]   His concerns are two fold. One is pain that seems to be going up to his mid  back level, and then he also has intermittent numbness in the left arm  sometimes affecting left fourth and fifth digits, sometimes the second and  third digits. He does not have any neck pain. He does sleep or fall asleep  with his hand behind his head.   Pain averaging 7/10 mainly in the low back area and posterior mid thighs.  Pain improves with rest and medications, made worse with prolonged sitting.   SOCIAL HISTORY:  Nonsmoker, married, has a 43 year old and trying to conceive  a second child.   VOCATIONAL:  Working full time __________ 48+ hours a week.   REVIEW OF SYSTEMS:  His chest pain is mainly in the left pectoralis. Has had  cardiac catheterization in the past. He feels this coming on when he does  have some pain going down his left arm with numbness once again mainly  positional. He had some anxiety improved with Xanax and counseling. Some  numbness in the left arm. Reflux, heartburn, frequent urination, weight  gain,  excess sweating, fevers, chills.   He is diabetic as noted above, type 1.   MEDICATIONS:  Other medications include Lidoderm patch on 12, off 12.   PHYSICAL EXAMINATION:  VITAL SIGNS:  Blood pressure 135/78, pulse 76,  respirations 20, O2 saturation 97% on room air.  GENERAL:  No acute distress. Gait is normal. Affect alert. Appearance is  normal. Sensation is normal bilateral lower extremities. He has good  strength bilateral lower extremities. No pain to palpation. No swelling of  the lower extremities. His back has tenderness to palpation left mid  thoracic area at the paraspinals. This causes some radiation down into the  left hip area. He also has some pain to palpation near the PSIS, left  greater than right, and once again, palpation of this area causes some  radiating pain down into the posterior thigh area on the left side.   IMPRESSION:  1. Lumbar facet arthropathy, pain mainly with extension. He has exquisite     pain if he falls asleep on his stomach by mistake.  2. Thoracic myofascial pain.  3. Left upper extremity thoracic outlet syndrome.   PLAN:  1. Referral for lumbar radiofrequency neurotomy.  2. Consider trigger points, left thoracic.  3. Will start PT after radiofrequency.  4. Consider Flexeril.      Connor Holt, M.D.  AEK/MedQ  D:  08/28/2003 14:44:58  T:  08/29/2003 11:58:38  Job #:  [id number]   cc:   Celene Kras, MD  Fax: [phone]

## 2010-05-28 NOTE — Procedures (Signed)
Connor Holt, Connor Holt          ACCOUNT NO.:  [id number]   MEDICAL RECORD NO.:  [id number]          PATIENT TYPE:  REC   LOCATION:  [facility]                          FACILITY:  [facility]   PHYSICIAN:  Erick Colace, M.D.DATE OF BIRTH:  July 23, 1965   DATE OF PROCEDURE:  11/25/2004  DATE OF DISCHARGE:                                 OPERATIVE REPORT   MEDICAL RECORD NUMBER:  [id number].   PROCEDURE:  Right lumbar RF.   INDICATIONS:  Improvement of chronic left sided back pain status post left  lumbar RF two months ago.   Medical problems include diabetes on insulin pump. Is not on any  anticoagulant medications at the current time.   Informed consent was obtained after describing risks and benefits of the  procedure to the patient. These include bleeding, bruising, infection, loss  of bowel and bladder function. He elects to proceed.   PROCEDURE:  The patient was taken to the fluoroscopy suite and placed in  prone position. Back prepped and draped in usual fashion. Skin and subcu  tissues infiltrates with 25-gauge 1-1/2-inch needle. Two cc of 1% lidocaine  was injected into each of three sites on the right side. Using a 22-gauge 10-  mm active tip 10-cm RF needle, the needle tip was advanced in the right S1  sacral ala junction. Both AP and lateral imaging confirmed proper needle  location. Motor stimulation at 3 volts demonstrated no lower extremity motor  stimulation. Then 1 cc of a solution containing 2.5 cc of 1% lidocaine and  0.5 cc of 40 mg/cc of Depo-Medrol was injection. Lesioning was performed at  70 degrees for 70 seconds. The next level was then performed targeting the  left SAP transverse process junction. Bone contact was made, confirmed  lateral imaging. Motor stimulation 3 millivolts demonstrated lower extremity  stimulation. Then 1 cc of Depo-Medrol/lidocaine solution injected, and then  lesioning performed 70 degrees for 70 seconds. Last L4 SAP transverse  process  junction was targeted. Bone contact was made and confirmed with  lateral imaging. Motor stimulation with 3 millivolts was performed. No lower  extremity twitching. One cc of the Depo-Medrol/lidocaine solution was  injected. Lesioning performed 70 degrees for 70 seconds. Preinjection pain  level 6/10. Postinjection 4/10. Pre/post injection vitals were stable.      Erick Colace, M.D.  Electronically Signed     AEK/MEDQ  D:  11/25/2004 14:29:00  T:  11/25/2004 16:08:46  Job:  [id number]

## 2010-05-28 NOTE — Assessment & Plan Note (Signed)
REASON FOR VISIT:  Low back pain.   HISTORY OF PRESENT ILLNESS:  The patient was last seen by me December 21, 2004.  He had a left lumbar ORIF in September 2006 and right lumbar ORIF  November 2006.  Pain overall much improved.  He is still recovering from  left ankle fracture which he suffered in November.  Has good mobility, some  decreased strength in the ankle, but with some chronic swelling.  He plans  to follow up with Dr. Simonne Come on this.  He is beginning to see an  endocrinologist, Dr. Everardo All to help regulate his blood sugars.   The pain level is rated a 4/10 on average, currently at 3, interferes with  activity at 4/10 level, relationship with other people 2/10, enjoyment of  life 3/10.  Sleep was fair. He can walk 15 minutes at a time, climb steps  and drive.  Works in [employer] 50 hours per week.  He has had  bronchitis this winter and a yeast infection.  He attributes some of this to  his poorly controlled diabetes.   PHYSICAL EXAMINATION:  VITAL SIGNS:  Blood pressure 127/78, pulse 108,  respirations 17, O2 saturation 97% on room air.  GENERAL APPEARANCE:  No acute distress.  Mood and affect appropriate.  NEUROLOGICAL:  Left range of motion is full.  He has no tenderness to  palpation of the lumbar spine.  He has some 75% lumbar flexion, 25-50% with  extension.   IMPRESSION:  1.  Lumbar facet syndrome, improved after radiofrequency procedure for seven      months post on the left and five months post on the right.  2.  History of medial malleolus fracture followed up by Orthopedics.   PLAN:  1.  He has already finished his physical therapy and is to continue on core      strengthening by some aerobic activity such as stationary bicycling.      Would like to get back to elliptical glide, but first want to get the      okay from Dr. Simonne Come.  2.  Continue Tramadol ER 100 mg daily.  3.  He continues to receive his Percocet, takes about 4-5 tablets per day  of      the 10 mg Oxycodone formulation through the [place].  4.  I will see him back in three months.  We discussed the average length of      relief after lumbar ORIF as approximately 12 months and will monitor for      this.      Erick Colace, M.D.  Electronically Signed     AEK/MedQ  D:  04/22/2005 10:23:55  T:  04/22/2005 11:44:49  Job #:  [id number]   cc:   Hewitt Shorts, M.D.  Fax: [phone]   [place]

## 2010-05-28 NOTE — Assessment & Plan Note (Signed)
[facility]                                   ON-CALL NOTE   NAME:Connor Holt, Connor Holt                 MRN:          [id number]  DATE:11/01/2005                            DOB:          1965-02-06    [facility] called at 6:33 p.m. on November 01, 2005 to verify a  prescription for DuoNeb but the patient did not have a nebulizer.  Apparently, the patient was treated by Dr. Everardo All for pneumonia, given  antibiotics and cough medicine and an Advair inhaler.  The patient was  unable to use the Advair inhaler so they called asking for a nebulizer and  the nurse called in DuoNeb but apparently not arranging for a nebulizer as  far as well know as of 6:30, November 01, 2005.  Since we could not get a  nebulizer tonight, I called in Symbicort 80/12.5 two puffs twice a day to  [facility] in [place].  Connor Holt felt that the patient would be fine until  tomorrow morning but wanted the inhaler just in case they needed it and then  they will call Dr. Everardo All in the office in the morning to arrange for a  nebulizer to be delivered to the house.       Lelon Perla, DO      YRL/MedQ  DD:  11/01/2005  DT:  11/02/2005  Job #:  [id number]   cc:   Gregary Signs A. Everardo All, MD

## 2010-05-28 NOTE — H&P (Signed)
NAMEDOLAN, XIA NO.:  [id number]   MEDICAL RECORD NO.:  [id number]                   PATIENT TYPE:  EMS   LOCATION:  [facility]                                 FACILITY:  [facility]   PHYSICIAN:  Gaspar Garbe, M.D.            DATE OF BIRTH:  1965/11/16   DATE OF ADMISSION:  09/18/2002  DATE OF DISCHARGE:                                HISTORY & PHYSICAL   CHIEF COMPLAINT:  Chest pain.   HISTORY OF PRESENT ILLNESS:  The patient is a 45 year old white male with a  history of diabetes mellitus type 1 diagnosed in 1993, well controlled with  recent initiation of insulin pump.  He has a history of chest pain, which  was in the process of being worked up over the past month and a half.  The  patient indicated that he was receiving relief with Maalox and was put on  Aciphex with PPIs and switched to Nexium twice a day, and attempted to see  Griffith Citron, M.D., regarding likelihood of GERD.  The patient has that  visit on August 24 was to be seen by me on August 26 for consideration of  Cardiolite.  The patient, however, failed to show up for this appointment.  The patient's wife called yesterday early afternoon, indicating that he was  once again having chest pain.  He was told to go to the emergency room.  He  decided to wait through the rest of the day to do so and arrived in the  emergency room shortly after 9 p.m.  His initial enzymes were negative.  As  the patient has a considerable cardiac history in his family, as well as a  personal history of diabetes mellitus type 1, and has not been completely  worked up, it haas not been completely ruled out at this point.   The patient states that this pain was typical for his other pains but seemed  to last longer.  He does not take anything for it or do anything about it at  this time.  He did not attempt to use Maalox.  The patient states that he  has a tightness in his chest, which he still has,  rating it a 1-2/10.  During the time in which he was initially seen in the emergency room, he has  refused Lovenox and considered signing out AMA.   The patient thinks he had a Cardiolite sometime between two and four years  ago at [facility], which was negative.   ALLERGIES:  TRAMADOL, nausea, vomiting, and itching.   MEDICATIONS:  1. Insulin by pump, Novolog at 12 midnight 0.8, 4 a.m. 1.1, 8 a.m. 0.8.  2. Synthroid 100 mcg per day.  3. Lovastatin 40 mg per day, which the patient himself discontinued.  Note     that the patient has been on Lipitor and Zocor and failed to continue     with  either one of these medications.  4. Nexium 40 mg b.i.d.  5. Vitamin C 1000 mg per day, self-initiated.  6. Reglan 5 mg a.c.  7. Percocet p.r.n.   PAST MEDICAL HISTORY:  1. Diabetes mellitus type 1 diagnosed in 1993 with recent initiation of     insulin pump within the past year.  The patient has had A1C's below 7.  2. Hypothyroidism diagnosed 2001.  3. Lumbar disk disease.  The patient had a steroid injection approximately     one month ago and had a fever shortly resulting.  He received  ________     and has not had problems with fevers since and no further workup has been     done.  Injection was done at the [place].  4. Hyperlipidemia; stopped medications secondary to joint aches.  5. Gastroesophageal reflux disease.   PAST SURGICAL HISTORY:  None.   SOCIAL HISTORY:  The patient lives in [place] with his wife and son.  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  He has one child.  He is a nonsmoker, nondrinker,  and denies any illicit drug usage.   FAMILY HISTORY:  Mother is alive and healthy.  Father died at ag 33 of an  MI.  He also had diabetes mellitus, juvenile type.  Siblings: He has two  obese siblings, one male, one male.  The patient has a history of bipolar  disorder.   REVIEW OF SYSTEMS:  The  patient denies fevers, chills, or sweats, any  headache, sore throat, or nasal discharge at this point.  No new rashes or  lesions are present.  The patient notes chest pain, which he describes as a  tightness, currently a 1/10, substernal, radiating to his throat.  No  shortness of breath or dyspnea on exertion is noted.  He denies any urinary  frequency or urgency.  He has positive gastroesophageal reflux symptoms with  no nausea, vomiting, or diarrhea.  All other systems are negative.  Advanced  directive: The patient is a full code.   PHYSICAL EXAMINATION:  VITAL SIGNS:  Temperature 98.9, pulse 81, respiratory  rate 20, blood pressure 152/88, oxygen saturation is 100% on room air.  Pain  is 1/10.  GENERAL:  No acute distress.  HEENT:  Normocephalic, atraumatic.  PERRLA.  EOMI.  EACs within normal  limits.  NECK:  Supple with no lymphadenopathy, JVD, or bruits.  HEART:  Regular rate and rhythm with murmurs, rubs, or gallops.  Pulses 2+  and equal bilaterally.  LUNGS:  Clear to auscultation bilaterally.  ABDOMEN:  Soft, nontender, normoactive bowel sounds.  EXTREMITIES:  No cyanosis, clubbing, or edema.   LABORATORY TESTS:  EKG showed rate of 73 with normal sinus rhythm, unchanged  from that seen in the office April 2004 with normal intervals and no  evidence of acute wave or ST changes or T wave inversions.  Hemoglobin 14,  hematocrit 40.  BUN 12, sodium 137, potassium 3.8, glucose 116.  First set  of enzymes at 2216 showed myoglobin 50.2, MB 1.5, and troponin 0.25.  All  other enzymes completed showed a myoglobin of 82.6, CK MB less than -1,  troponin 0.05.   ASSESSMENT:  1. This is a man with chest pain.  Will rule out for MI.  I agree that the     patient has considerable risk factors and was in the process of     investigating  this; however, the patient was not compliant with followup    and has not been complaint with preventative medications, including     continuing his  aspirin and not tolerating three different statins.  For     pain related to the complaints below, the patient relates a history of     back pain and other joint pain, which would be more consistent with     arthritis and degenerative disk disease.  Will rule him out for MI over a     period of 12 hours, which will complete right around noon time.  Due to     his late arrival to the hospital, he will not be able to complete     Cardiolite testing today, and in fact, will be scheduled for one early     next week as an outpatient.  The patient has refused Lovenox so aspirin     was given.  2. Diabetes mellitus type 1.  Continue insulin pump.  3. Gastroesophageal reflux disease.  Continue the patient is on Nexium.  4. Hyperlipidemia.  As noted above, the patient has been intolerant.  Will     consider trying him on Crestor.  He does have an appointment tomorrow in     my office if he rules out.  Will start him on some sample pack there as     well as discussing other preventative issues such as maintaining aspirin     and consideration of an ACE inhibitor, which has been discussed with him     in the past but refused at that time.   Given that he is having to stay in the hospital for rule out MI, he might be  more compliant with both visits and with preventative therapy.                                                Gaspar Garbe, M.D.    RWT/MEDQ  D:  09/19/2002  T:  09/19/2002  Job:  [id number]   cc:   Griffith Citron, M.D.  [facility] [place]  [place] [address]  Fax: [phone]

## 2010-05-28 NOTE — Assessment & Plan Note (Signed)
MEDICAL RECORD NUMBER:  [id number].   Date of last visit November 25, 2004 for right lumbar RF.   The patient had left lumbar RF in September 2006, had right lumbar RF  November 25, 2004. His back pain is much improved. In fact, he felt good  enough Thanksgiving to go get up in a deer stand, something he has not been  able to do in three years. However, on the way to the deer stand, he fell  into a hole and broke his left medial malleolus and has seen Dr. Simonne Come  and is in a cast boot with weight bearing as tolerated. 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 He is able to climb steps. He is  able to drive. Pain is worse with bending, sitting, standing. Improves with  heat, ice, medications. Three out of 4 pain but still taking 6 to 8 Percocet  per day.   PAST MEDICAL HISTORY:  Diabetes with insulin pump.   REVIEW OF SYSTEMS:  Positive for weight gain. He is up to 230 pounds due to  decreased activity level.   The patient's goal is to get back into an exercise program. He has cardiac  evaluation in the past with negative cardiac catheterization approximately  two years ago.   PHYSICAL EXAMINATION:  VITAL SIGNS:  Blood pressure 134/74, pulse 71,  respirations 15.   Back has no tenderness to palpation. He has 75% flexion and extension.   Mood and affect is appropriate.   His lower extremity strength is good.   IMPRESSION:  1.  Improvement in back pain status post lumbar radiofrequency. He can wean      down on her Percocet one tablet per week. Once he gets down to four      Percocet a day, we would like him to start Ultram ER 100 mg. I have      given him samples for this.  2.  Follow up with Dr. Simonne Come for his ankle fracture and will likely need      some PT related to that which will be      handled through Dr. Simonne Come, but after he completes that, I would like      to start him on some therapy for his back. I will see him back in  approximately six weeks.      Erick Colace, M.D.  Electronically Signed     AEK/MedQ  D:  12/21/2004 12:09:57  T:  12/21/2004 16:38:17  Job #:  [id number]   cc:   Marlowe Kays, M.D.  Fax: [phone]   Hewitt Shorts, M.D.  Fax: [phone]   Gladstone Pih, Ph.D.  [address] [place]  [place] [address]   Gaspar Garbe, M.D.  Fax: [phone]

## 2010-05-28 NOTE — Assessment & Plan Note (Signed)
Patient last seen by me on April 22, 2005.  Seen by Dr. Newell Coral.  He had  lumbar radiofrequency procedure in September 2006, as well as September  2005.  Right lumbar radiofrequency procedure in November 2006 and September  2005.  Overall, his pain was been well controlled.  He has been able to  reduce pain medications.   PHYSICAL EXAMINATION:  GENERAL:  No acute distress.  Mood and affect  appropriate.  BACK:  No tenderness to palpation.  He has good flexion and extension of his  lumbar spine and good strength.  LOWER EXTREMITIES:  His gait is normal.  No evidence of toe drag or knee  instability.   IMPRESSION:  1.  Lumbar facet syndrome.  He may be experiencing some paresthesias from      nerve regeneration on the right side.  I would treat these with      Neurontin.  He does have some of this through the [place], and he can take      this.  2.  I do not think he needs repeat radiofrequency at this time.  I will see      him back in 2 months and if he has more consistent pain, then schedule      him for repeat radiofrequency.      Erick Colace, M.D.  Electronically Signed     AEK/MedQ  D:  07/21/2005 16:55:30  T:  07/21/2005 17:39:44  Job #:  [id number]

## 2010-05-28 NOTE — Assessment & Plan Note (Signed)
[name] returns today.  He has had no new problems.  [facility].  He has had no new medical problems.  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  He is continuing with his home exercise program.   MEDICATIONS:  1. Ultram ER 200 mg a day.  2. Oxycodone as prescribed by [place].  He takes about 6-8 tablets of      Percocets per day.   Pain level is 3-5/10.  Function independent, working 40-50 hours per  week.   PHYSICAL EXAMINATION:  VITAL SIGNS:  Blood pressure 137/77, pulse 97,  respiratory rate 16, O2 saturation 95% on room air.  NEUROLOGIC:  Orientation x3.  Gait is normal.  Affect is bright and  alert.  GENERAL:  In no acute distress.  MUSCULOSKELETAL:  His back has no tenderness to palpation of the lumbar  spine.  He has some pain with extension of his spine greater than with  flexion.  He has 75% forward flexion, 25% extension.  Lower extremity  strength is normal.  Range of motion normal in the hips, knees, ankles.  Deep tendon reflexes are normal.   IMPRESSION:  Lumbar facet syndrome, with chronic low back pain.  Doing  well, status post repeat radiofrequency neurotomy.   PLAN:  1. Continue Ultram.  2. [facility].  3. I will see him back in about two months, and we can make less      frequent visits if he continues to be stable.      Erick Colace, M.D.  Electronically Signed     AEK/MedQ  D:  01/23/2006 16:12:49  T:  01/24/2006 07:28:16  Job #:  [id number]

## 2010-05-28 NOTE — Assessment & Plan Note (Signed)
The patient follows up today after I last saw him on November 15, 2005.  He had a repeat right-sided L5 dorsal ramus radiofrequency neurotomy,  right L4 and L3 medial branch radiofrequency neurotomy. Overall, is  doing alright. However, he does note that since his pneumonia he has not  gotten back to his home exercise program as he had been doing  previously. [facility] 168 tablets per  months of the 5/325. We did increase his Ultram ER to 200 mg a day  and  this has resulted in a mild improvement.   He has had no other medical complications in the interval time.   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 He would like to get back into some physical therapy as I had  previously recommended, but he needs to see what his new work schedule  is like before he can commit. His pain is improving with rest, heat,  medication. He can drive 45 minutes at a time before pain increases.  Please see Health and History Form for review of systems.   His blood pressure is 143/97, pulse is 95, respiratory rate is 16. O2  sat is 96% on room air.  GENERAL: In no acute distress. Alert and oriented x3. Gait is normal.  His back has mild tenderness to palpation in the paraspinal muscles. He  has normal strength in the lower extremity and normal hip, knee and  ankle range of motion. Gait is without antalgia. No evidence of toe  dragging or knee instability.   IMPRESSION:  Lumbar facet syndrome.   PLAN:  1. Is to continue Ultram ER 200 mg a day.  2. Oxycodone per the [facility].  3. I will see him back in approximately 1 month.      Erick Colace, M.D.  Electronically Signed     AEK/MedQ  D:  12/12/2005 11:09:21  T:  12/12/2005 19:30:54  Job #:  [id number]   cc:   George Hugh, MD

## 2010-05-28 NOTE — Procedures (Signed)
Connor Holt, Connor Holt                      ACCOUNT NO.:  [id number]   MEDICAL RECORD NO.:  [id number]                   PATIENT TYPE:  REC   LOCATION:  [facility]                                  FACILITY:  [facility]   PHYSICIAN:  Erick Colace, M.D.           DATE OF BIRTH:  1965-08-30   DATE OF PROCEDURE:  05/29/2003  DATE OF DISCHARGE:                                 OPERATIVE REPORT   PROCEDURE:  Bilateral L3-4 medial branch blocks as well as L5 dorsal ramus  injection.   Informed consent  was obtained after describing risks and benefits of the  procedure with the patient.  The patient wishes to proceed.  He has had the  procedure explained to him by myself with the use of a spine model and  described possible complications including temporary or permanent paralysis,  loss of sensation, loss of bowel or bladder function and he wishes to  proceed.   The patient placed prone on the fluoroscopy table.  Area prepped with  Betadine, draped in sterile fashion.  Lidocaine 1% x1.5 mL was injected  fluoroscopic marker placed first at left S1 SAP/alar junction.  Then a 22-  gauge spinal needle was utilized to anesthetize the L5 dorsal ramus after  manipulated into proper positioning under multiple fluoroscopic images.  Prior to the injection, Omnipaque 180 x0.3 mL was injected and demonstrated  no evidence of intravascular uptake on life fluoroscopy.  Then 0.5 mL of 2%  methylparaben-free lidocaine were injected into the side.  This procedure  was repeated exactly at right S1/alar junction using identical procedure and  medications.  This was then performed at right L4 and L3 corresponding to L5  SAP transverse process junction and L4 SAP transverse process junction.  The  same procedure was performed at left L4 SAP transverse process junction and  left L5 SAP transverse process junction.  The patient tolerated the  procedure well.  Post injection instructions given.  The patient will  return  in two weeks to follow up.                                                Erick Colace, M.D.    AEK/MEDQ  D:  05/29/2003 14:00:24  T:  05/29/2003 14:49:57  Job:  [id number]   cc:   Gladstone Pih, Ph.D.  [address] [place]  [place] [address]   Gaspar Garbe, M.D.  [address]  [place]  [place] [address]  Fax: [phone]

## 2010-05-28 NOTE — Procedures (Signed)
NAMEBERTHOLD, GLACE          ACCOUNT NO.:  [id number]   MEDICAL RECORD NO.:  [id number]          PATIENT TYPE:  REC   LOCATION:  [facility]                          FACILITY:  [facility]   PHYSICIAN:  Celene Kras, MD        DATE OF BIRTH:  [birth year]   DATE OF PROCEDURE:  DATE OF DISCHARGE:                                 OPERATIVE REPORT   PATIENT:  Connor Holt.   DATE OF BIRTH:  [birth year].   SURGEON:  Jewel Baize. Stevphen Rochester, M.D.   [facility] and I  evaluated him.  I have reviewed the Health and history form. I reviewed the  14 point review of systems.   1.  He has had a remarkable improvement to the right side after facetal      injection, radiofrequency neuroablation, and I think it is reasonable to      go to the left side.  This is unmasked as a significant pain generator      at this time, limiting function and quality of life indices.  2.  I will hold off any further interventional procedures after this RF to      the left side, addressing the L5-S1, L4-5, L3-4, L2-3 pain generator,      contributory innervation addressed.  This will be performed under local      anesthetic, and I have refreshed the risks.  3.  Will follow up as well and discuss in consultation as potential      escalation of blood sugar, increase in pain postprocedurally, and then      defervescence.  Should he have any further problems, they will let us      know.  4.  Review of medication:  He is appropriate.   OBJECTIVE:  No significant change in neurological or musculoskeletal  presentation.  Alert and engaging.  Myofascial pain to the right, but no new  neurological findings motor, sensory or reflexive.   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   PLAN:  Facet neurotomy to the left side under local anesthetic, 10 mm active  tip, L5-S1, L4-5, L3-4, L2-3, contributory innervation addressed.  Independent needle  access points.  He is consented.   PROCEDURE:  Patient taken to fluoroscopy suite and placed in prone position,  back prepped and draped in the usual fashion.  Using a 22-gauge RF needle, I  advanced to the facet, L5-S1, L4-5, L3-4, L2-3, left side, independent  needle access points, contributory innervation addressed.  Confirmed  placement in multiple fluoroscopic positions.  I used Isovue 200,  appropriately stimulated motor and sensory.  I then followed with 1 mL of  lidocaine 1% NPF at each level with a total of 40 mg of Aristocort in  divided dose.   Lesion performed, 70 degrees for 70 seconds at each level.   Tolerated the procedure well, no complication from our procedure,  appropriate recovery.  Discharge instructions given.  No complications  identified.  Assess this in the context of activities of daily living, and  will  follow up with him.       HH/MEDQ  D:  10/07/2003 11:54:14  T:  10/08/2003 07:18:43  Job:  [id number]

## 2010-05-28 NOTE — Assessment & Plan Note (Signed)
The patient follows up after last seen October 06, 2005, for a repeat  right-sided L5 dorsal ramus radiofrequency neurotomy, right L4 and right L3  medial branch radiofrequency neurotomies.  He had no post injection  complications in the interval time.  However, he has had bilateral pneumonia  and missed several weeks of work and stayed in bed quite a bit.  He has  really not gotten back to doing his usual walking program.  His pain is  about 4/10 to 5/10.  His meds include Ultram ER 100 mg a day, and then  oxycodone 5/325; he gets 168 tablets per month through the [place].   He feels quite weak overall although no longer has any breathing issues.  On  the plus side, he has been given a promotion at his job to the [employer] level.   REVIEW OF SYSTEMS:  Positive for anxiety.  Otherwise negative.   Blood pressure 133/76, pulse 87, respiratory rate is 16, and O2 sat is 94%  on room air.  GENERAL:  In no acute distress.  BACK:  No tenderness to palpation.  He has pain with extension, as well as  with end-range flexion.  Gait is without evidence of toe-drag or knee instability.  He is in no acute  distress.   IMPRESSION:  Lumbar facet syndrome status post repeat radiofrequency with  interval history positive for debilitating illness, overall feels weak.   PLAN:  We will increase his Ultram ER to 200 mg a day.  Continue oxycodone  as per [employer].  The patient may want to switch his narcotic  analgesic prescription to this clinic, in which case we would need a urinary  drug screen and nursing visits q. month.   I will see him back in 1 month, consider physical therapy if he is not  gaining his strength back.      Erick Colace, M.D.  Electronically Signed     AEK/MedQ  D:  11/15/2005 10:42:24  T:  11/15/2005 15:34:20  Job #:  [id number]   cc:   Dr. Corky Sing

## 2010-05-28 NOTE — Assessment & Plan Note (Signed)
Patient last seen by me for follow-up of right lumbar RF.  He had a left  lumbar RF in September and the right one was in November 2006.  His back  pain has been much improved.  In fact, he broke his ankle trying to go deer  hunting and now has been released by Dr. Simonne Come.  He is just in an ankle  lace-up splint at this time.  States he has full range of motion of his  ankle.  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  He is on 60% VA disability and gets his  medications through the [place].  He rates his pain as a 2-3/10, better with  heat, medications, worse with bending, sitting, standing.  He has had some  weight gain and high blood sugars but is diabetic with insulin pump.  He has  had recent bout of bronchitis, treated with antibiotics.   PAST MEDICAL HISTORY:  Diabetes and thyroid disease.   PHYSICAL EXAMINATION:  VITAL SIGNS:  Blood pressure 138/87, pulse 110,  respirations 18, O2 saturation 98% on room air.  GENERAL:  In no acute distress, mood and affect bright and alert, oriented.  MUSCULOSKELETAL/NEUROLOGIC:  His left ankle range of motion is full,  although he does not feel like it is as stable as the other and tends to  favor it during gait.  He has no tenderness to palpation of the lumbar  spine.  He has 50-75% lumbar flexion, 25% extension with pain during  extension mainly.   IMPRESSION:  1.  Lumbar facet syndrome, much improved after lumbar radiofrequency      procedure, is four months post on the left and two months post on the      right.  2.  Status post medial malleolus fracture.  He has been released by      orthopedics, has not had any physical therapy for this.   PLAN:  As I discussed before, I think he would benefit from physical therapy  not only for his back for core strengthening and aerobic activities, but  also for his ankle.  He will go to [facility] for this.   I will see him back  in two months.  He will talk to his [place] doctors about  switching him from Percocet to Tramadol.  I did give him some samples of the  Tramadol ER 100 mg last time, which he states were helpful.      Erick Colace, M.D.  Electronically Signed     AEK/MedQ  D:  02/01/2005 14:17:11  T:  02/02/2005 06:52:56  Job #:  [id number]   cc:   Marlowe Kays, M.D.  Fax: [phone]   Hewitt Shorts, M.D.  Fax: [phone]   Gaspar Garbe, M.D.  Fax: [phone]

## 2010-05-28 NOTE — Group Therapy Note (Signed)
HISTORY:  This 45 year old male with the onset of low back pain  approximately two years ago.  He remembers loading up for a trip and getting  into the car, and sitting for a half-hour and having increasing low back  pain.  This progressed and involved the right lower extremity, the dorsum of  the foot, with some numbness in the dorsum of the foot.  This has  subsequently resolved in terms of the lower extremity, but he continues to  have back pain across the low back bilaterally.  He was a golfer at the  time, but had no other traumatic injury.  He does note that he was [employer], and jumped out of airplanes and speeding boats, and got bruised up on  many occasions, per his report.  He had intermittent back pain that resolved  with ibuprofen usage.  He states that his pain is worse with end-range  bending, lifting and holding, even onto light objects and prolonged sitting.  He has tried doing some yard work, and uses a Environmental consultant, but he needs to  take several rest breaks, and it takes him about two days to weed-eat his  yard.  His pain improves with rest, and he does well with his medication.  He has been tried in the past on methadone 10 mg t.i.d., which resulted in a  caffeine-type feeling.  He has had morphine, although I do not have this  dosage.  It did cause some mental fogging, effecting him at work.  He has  tried NSAID's, Vicodin and Tramadol, which have not been particularly  helpful.  He tried a TENS unit, which was not particularly helpful.  He had  one L3-4 lumbar epidural steroid injection with 80 mg of Depo-Medrol, which  was not particularly helpful either.  He had an MRI of the lumbosacral spine  at [facility] showing a minimal posterior annular tear  at L5-S1, a tiny protrusion to the right of the midline.  He had some mild  facet degenerative changes at L4-5 and L5-S1.  His lumbar x-ray, which I have also reviewed along with his MRI in the  presence of the patient, and showing him changes, showed facet degenerative  changes at L4-5 and more particularly at L5-S1.   PAST MEDICAL HISTORY:  1. Significant for diabetes.  He had the onset at age 89.  He has had     insulin since that time.  He had an insulin pump placed approximately one     year ago.  2. He has also had chest pain, and had a positive Cardiolite; however, a     negative cardiac catheterization last summer.  Dr. Adelfa Koh. Tisovec was     his cardiologist.  3. He does have some time to time some shortness of breath with activity,     and even some left arm pain, but he also notes that this was anxiety.  4. He also notes low-grade temperatures in the high 99 degree range,     occasionally 100 degrees.   FAMILY HISTORY:  Positive for diabetes and heart disease.   SOCIAL HISTORY:  He is married.  Works full-time.  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  His pain level is averaging 5/10, going from 4-7.   REVIEW OF SYSTEMS:  As noted above, also some reflux, heartburn, and has had  an upper endoscopy which was not felt to be causing his chest discomfort.  He has frequent urinations  and abdominal pain, mostly upper abdominal  problems.  He has gained some weight, but clearly is not overweight at this  point.  He has anxiety, depression and poor sleep, but no suicidal thoughts.  Some agitation and feels weak all over.   PHYSICAL EXAMINATION:  VITAL SIGNS:  Temperature 99.9 degrees, blood  pressure 135/96, pulse 121, respirations 16, O2 saturation 97% on room air.  NEUROLOGIC:  Gait is normal.  He is able to heel walk and toe walk.  He has  no tenderness to palpation of the lumbar spine.  No pain over the PSIS or in  the buttock area, or over the hip area.  His lumbar spine range of motion is  full, and does not have any significant pain in any direction.  He has  normal deep tendon reflexes in the bilateral upper and lower extremities.  Normal strength in the bilateral  upper and lower extremities.  Normal range  of motion in the bilateral upper and lower extremities.  Febre's is negative  bilaterally.  His straight leg raising test is negative.   IMPRESSION:  1. Primarily axial low back pain with moderate to severe pain limiting his     activities.  He has had partial relief with narcotic analgesics; however,     he would like to come off of these, which I think is reasonable.  The     etiology of his pain is either discal versus facet, and he does have some     mild findings on the MRI in both of these areas.  He did have a radicular     component which has subsequently resolved in the right lower extremity.  2. Anxiety and depression related to above.  3. History of insulin-dependent diabetes mellitus.  4. Low-grade temperatures in a setting of significant diabetes and low back     pain.   PLAN:  1. Will check a bone scan.  2. If the bone scan is negative for any infection, would do a medial branch     block at bilateral L5 dorsal ramus, L3 and L4 medial branches.  3. Reduce his oxycodone slowly, eight tab daily to q.i.d., that is down from     10 tab daily.  4. Lidoderm patch to the low back, to see if this can help as an adjunct.  5. Consider acupuncture.  6. He will need physical therapy at some point.  7. If medial branch block is positive, consider radiofrequency.  8. I will see him back in three to four weeks.     Erick Colace, M.D.   AEK/MedQ  D:  04/29/2003 11:16:53  T:  04/29/2003 12:23:36  Job #:  [id number]   cc:   [facility] [place] [place]   Hewitt Shorts, M.D.  [address]  [place]  [place] [address]  Fax: [phone]   Gaspar Garbe, M.D.  [address]  [place]  [place] [address]  Fax: [phone]

## 2010-05-28 NOTE — Procedures (Signed)
NAMEJAHEL, WAVRA          ACCOUNT NO.:  [id number]   MEDICAL RECORD NO.:  [id number]           PATIENT TYPE:   LOCATION:                               FACILITY:  [facility]   PHYSICIAN:  Erick Colace, M.D.DATE OF BIRTH:  06-01-1965   DATE OF PROCEDURE:  DATE OF DISCHARGE:                                 OPERATIVE REPORT   PROCEDURE:  Left-sided lumbar radiofrequency L5 dorsal ramus, L4 medial  branch, L3 medial branch, as well as L4 medial branch block, L5 dorsal ramus  injection, and L3 medial branch block under fluoroscopic guidance.   Informed consent was obtained after describing the risks and benefits of the  procedure to the patient.  These include bleeding, bruising, infection, loss  of bowel and bladder function, and temporary or permanent paralysis.  He  elects to proceed and has given written consent.   A 1-1/2 inch, 25-gauge needle was used to anesthetize the skin and  subcutaneous tissue, 1% lidocaine x 2 cc at each of three sites.  Then, a 20-  gauge RF needle, 10 mm active tip curved, was inserted under fluoroscopic  guidance, first targeting the S1 SAP sacroiliac junction.  Bone contact  made.  Confirmed with lateral imaging.  Motor stim 3 pulse demonstrated no  lower extremity twitch.  That is followed by injection of the solution  containing 1 cc of 40 mg/cc Depo-Medrol and 2 cc of 1% MPF lidocaine x 1 cc,  then RF lesioning 70 degrees x 70 seconds.  Next, the left L5 SAP transverse  process junction was targeted.  Bone contact made.  Confirmed with lateral  imaging.  Motor stim x3 volts demonstrated lower extremity twitch.  This was  followed by injection of the 1 cc of the Depo-Medrol/lidocaine solution  injectate, and followed by RF lesioning 70 degrees x 70 seconds.  Last, the  left L4 SAP transverse process junction was targeted.  Bone contact made.  Confirmed with lateral imaging.  The motor stim initially produced some  lower extremity twitch.   Then, the needle was backed off gradually, and  motor stim then did not produce any lower extremity twitch, and positioning  reconfirmed by fluoroscopic imaging AP and lateral planes, followed by  injection of the 1 cc of the Depo-Medrol/lidocaine solution and RF lesioning  70 degrees x 70 seconds.  The patient tolerated the procedure well.  Pre-  and postinjection vital stable.  Preinjection pain level was 4/10,  postinjection 1-3.  Return in 3 weeks for the right-sided RF lesioning.      Erick Colace, M.D.  Electronically Signed     AEK/MEDQ  D:  09/15/2005 17:38:23  T:  09/16/2005 05:53:07  Job:  [id number]

## 2010-05-28 NOTE — Procedures (Signed)
NAMEDAIL, LEREW          ACCOUNT NO.:  [id number]   MEDICAL RECORD NO.:  [id number]          PATIENT TYPE:  REC   LOCATION:  [facility]                          FACILITY:  [facility]   PHYSICIAN:  Erick Colace, M.D.DATE OF BIRTH:  08/08/1965   DATE OF PROCEDURE:  09/30/2004  DATE OF DISCHARGE:                                 OPERATIVE REPORT   MEDICAL RECORD NUMBER:  [id number].   Connor [facility]. I  evaluated him and reviewed his health and history form and reviewed 14-point  review of systems. Reviewed medication list. Asked regarding any interval  history that is significant. He has no new medical problems since last seen  in clinic approximately one year ago. He is here for lumbar RF. He had very  good relief with lumbar RF performed on the right side September 16, 2003 and  on the left side October 08, 2003 here at this clinic. He had good  response to medial branch blocks prior to his total relief with the lumbar  RF was approximately nine months, and he has had subsequent decline in his  functional indices.   I described risks and benefits of this procedure including bleeding,  bruising, infection. Given he is a brittle diabetic on an insulin pump, risk  of infection is increased. He understands and has given written consent. His  gait is normal. Mood and affect are appropriate. He has good strength in  bilateral lower extremities and only minimal tenderness at the lumbosacral  junction.   PROCEDURE:  The patient was taken to fluoroscopy suite, placed in prone  position, back prepped and draped in usual fashion. Skin and subcu  infiltrated with a 25-gauge 1-1/2-inch needle. Two cc of 1% lidocaine at  each of three sites on the left side. Using a 22-gauge 10-mm active tip  advanced under fluoroscopic guidance to the L5 dorsal ramus, i.e. S1 sacra  ala transverse junction. Both AP and lateral imaging confirmed proper  needle  location. Motor stimulation at 3 volts demonstrated no lower extremity motor  stimulation. Then, 1 cc of a solution containing 2.5 cc of 1% lidocaine and  0.5 cc of 40 mg/cc Depo-Medrol was injected. Then, lesioning was performed  at 70 degrees for 70 seconds. The next level was then performed, targeting  the left L5 SAP transverse process junction. Bone contact made, confirmed  lateral imaging and motor stimulation, 3 millivolts demonstrated no lower  extremity stimulation. Then 1 cc of the Depo-Medrol lidocaine solution was  injected and then lesioning performed 70 degrees for 70 seconds. Last, the  L4 SAP transverse process junction was targeted, bone contact is made,  confirmed lateral imaging, motor stimulation 3 millivolt performed, no lower  extremity twitching. One cc of the Depo-Medrol lidocaine solution injected  and lesioning performed 70 degrees 70 seconds. The patient tolerated the  procedure well. The patient to come back  in one month and have the right  side done. Preprocedure pain level 7/10, postprocedure numb.      Erick Colace, M.D.  Electronically Signed    AEK/MEDQ  D:  09/30/2004 14:59:03  T:  10/01/2004 10:42:52  Job:  [id number]

## 2010-05-28 NOTE — Cardiovascular Report (Signed)
NAMEKHADIM, Connor Holt                      ACCOUNT NO.:  [id number]   MEDICAL RECORD NO.:  [id number]                   PATIENT TYPE:  OIB   LOCATION:  2895                                 FACILITY:  [facility]   PHYSICIAN:  Thereasa Solo. Little, M.D.              DATE OF BIRTH:  Jul 26, 1965   DATE OF PROCEDURE:  10/01/2002  DATE OF DISCHARGE:                              CARDIAC CATHETERIZATION   INDICATIONS FOR TEST:  Mr. Eves is a 45 year old male who has been  diabetic for 11-1/2 years and he is on an insulin pump.  He has had chest  pain significant about four years ago.  It seemed to improve, then over the  last two to three months increasing episodes of chest pain.  His  cardiovascular risk factors include his father who died at age 71 of a  myocardial infarction, hyperlipidemia, and diabetes mellitus.  He does not  smoke cigarettes.   A nuclear study was performed on September 26, 2002 that shows severe  ischemia in the anterior lateral wall.   PROCEDURE:  The patient was prepped and draped in the usual sterile fashion  exposing the right groin.  Following local anesthetic with 1% Xylocaine the  Seldinger technique was employed and a 5-French introducer sheath was placed  into the right femoral artery.  Left and right coronary arteriography and  ventriculography in the RAO projection was performed.   COMPLICATIONS:  None.   RESULTS:  HEMODYNAMIC MONITORING:  Central aortic pressure 128/85.  Left ventricular pressure 130/5 with no  significant valve gradient at time of pullback.   VENTRICULOGRAPHY:  Ventriculography in the RAO projection revealed normal systolic function.  Ejection fraction greater than 55%.  The end-diastolic pressure was 11.   CORONARY ARTERIOGRAPHY:  No calcification was seen on fluoroscopy.  1. Left main normal.  2. The LAD actually bifurcated into twin LADs in the mid portion.  There was     a very proximal diagonal or optional diagonal vessel.   This entire system     was free of disease.  3. Circumflex gave rise to three OM vessels.  OM #1 and OM #2 were very     large in diameter and free of disease.  OM #3 was small and free of     disease.  4. Right coronary artery gave rise only to the PDA.  This system was widely     patent.   CONCLUSION:  1. Normal left ventricular systolic function.  2. No evidence of coronary artery disease and, in fact, all the vessels are     large at about 3.5-4 mm in diameter.   I cannot explain his chest pain nor the nuclear study based on his coronary  anatomy.  It appears that the stress test was a false positive study.  The  patient will be discharged home today and I will reevaluate in the office  tomorrow.  Thereasa Solo. Little, M.D.    ABL/MEDQ  D:  10/01/2002  T:  10/01/2002  Job:  [id number]   cc:   [facility]   Gaspar Garbe, M.D.  [address]  [place]  [place] [address]  Fax: [phone]

## 2010-05-28 NOTE — Assessment & Plan Note (Signed)
[name] returns today.  He last saw me January 23, 2006.  [facility], takes 5/325 one hundred and eighty  tablets per month.  He has had no new medical problems.  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  He is  continuing his home stretching program after he loosens up by laying on  a heating pad.   MEDICATIONS:  Ultram ER 200 mg a day as noted, Percocet as well.   Pain level is 4-5 out of 10.  Sleep is poor.  Relief from meds is fair  to good.  He can walk 10+ minutes at a time.  He can climb steps, he  drives long distances for his job.  Positive anxiety on review of  systems as well as weight gain.  He has occasional high blood pressures,  he is diabetic.   His blood pressure is 127/82, pulse 81, respiratory rate 16, O2 sat 98%  on room air.  GENERAL:  No acute distress.  Mood and affect appropriate.  His back has no tenderness to palpation.  He has pain more with  extension than with flexion, but overall doing well.  He has more right  sided pain than left sided pain.  Lower extremity strength is normal.  Deep tendon reflexes are normal.   IMPRESSION:  Lumbar facet syndrome, chronic low back pain, last  radiofrequency neurotomy was done on the right side, and this was in  September of 2007.  Our goal is to get 1 year relief from this  procedure.  He will be seen back in 4 months; however, if he develops  increasing pain prior to that time, we may be able to schedule him  sooner.      Erick Colace, M.D.  Electronically Signed     AEK/MedQ  D:  04/13/2006 16:56:26  T:  04/13/2006 17:43:26  Job #:  [id number]

## 2010-06-10 ENCOUNTER — Ambulatory Visit: Admitting: Physical Medicine & Rehabilitation

## 2010-06-10 ENCOUNTER — Encounter

## 2010-06-10 DIAGNOSIS — M25559 Pain in unspecified hip: Secondary | ICD-10-CM

## 2010-06-11 NOTE — Assessment & Plan Note (Signed)
REASON FOR VISIT:  Right groin pain.  HISTORY:  Connor Holt is a 45 year old male who has chronic back pain but has had more recent history of right groin pain and hip pain, right upper leg pain for 4 months.  He in fact was seen by a general surgeon, underwent abdominal hernia repair and underwent laparoscopy for suspected inguinal hernia which is really not visualized.  He had repeat MRI which was really nonrevealing, no changes since 2004.  He had an MRI of the right hip and this identified a right obturator externus, partial tear intrasubstance secondary stress injury at the symphysis pubis.  He had no intra-articular pathology.  PHYSICAL EXAMINATION:  MUSCULOSKELETAL:  He has pain with resisted hip adduction.  He has pain with passive hip flexion above 90 degrees.  IMPRESSION:  Obturator externus tear about 4 months post partial, some reactive changes at the symphysis pubis.  This appears to coincide with the location of his pain.  I do not think any further workup is needed. I do think he will require some outpatient physical therapy, some ultrasound to that area.  We will go ahead and get this set up.  I will see him back in a month to monitor his progress.  Discussed with the patient, agrees with plan.     Erick Colace, M.D. Electronically Signed    AEK/MedQ D:  06/10/2010 14:34:56  T:  06/11/2010 01:08:33  Job #:  [id number]  cc:   [facility]

## 2010-07-16 ENCOUNTER — Ambulatory Visit: Admitting: Physical Medicine & Rehabilitation

## 2010-10-22 LAB — POCT I-STAT CREATININE: Creatinine, Ser: 1

## 2010-10-22 LAB — I-STAT 8, (EC8 V) (CONVERTED LAB)
Acid-Base Excess: 3 — ABNORMAL HIGH
Bicarbonate: 29 — ABNORMAL HIGH
HCT: 47
Operator id: 235561
pCO2, Ven: 47
pH, Ven: 7.398 — ABNORMAL HIGH

## 2011-01-10 ENCOUNTER — Other Ambulatory Visit: Payer: Self-pay | Admitting: Endocrinology

## 2011-01-30 ENCOUNTER — Emergency Department: Payer: Self-pay | Admitting: Emergency Medicine

## 2011-01-30 LAB — CBC
HGB: 14 g/dL (ref 13.0–18.0)
MCH: 28.4 pg (ref 26.0–34.0)
RBC: 4.95 10*6/uL (ref 4.40–5.90)

## 2011-01-30 LAB — COMPREHENSIVE METABOLIC PANEL
Albumin: 4.2 g/dL (ref 3.4–5.0)
Alkaline Phosphatase: 88 U/L (ref 50–136)
Anion Gap: 7 (ref 7–16)
BUN: 10 mg/dL (ref 7–18)
Bilirubin,Total: 0.3 mg/dL (ref 0.2–1.0)
Chloride: 103 mmol/L (ref 98–107)
Co2: 30 mmol/L (ref 21–32)
Creatinine: 0.84 mg/dL (ref 0.60–1.30)
EGFR (Non-African Amer.): >60
Osmolality: 279 (ref 275–301)
SGPT (ALT): 30 U/L
Sodium: 140 mmol/L (ref 136–145)
Total Protein: 8.1 g/dL (ref 6.4–8.2)

## 2011-01-30 LAB — DRUG SCREEN, URINE
Amphetamines, Ur Screen: NEGATIVE (ref ?–1000)
Cocaine Metabolite,Ur ~~LOC~~: NEGATIVE (ref ?–300)
MDMA (Ecstasy)Ur Screen: NEGATIVE (ref ?–500)
Methadone, Ur Screen: NEGATIVE (ref ?–300)
Opiate, Ur Screen: POSITIVE (ref ?–300)
Tricyclic, Ur Screen: NEGATIVE (ref ?–1000)

## 2011-01-30 LAB — SALICYLATE LEVEL: Salicylates, Serum: <1.7 mg/dL

## 2011-01-30 LAB — URINALYSIS, COMPLETE
Bilirubin,UR: NEGATIVE
Blood: NEGATIVE
Ketone: NEGATIVE
Leukocyte Esterase: NEGATIVE
Nitrite: NEGATIVE
Ph: 6 (ref 4.5–8.0)
RBC,UR: 1 /HPF (ref 0–5)
Squamous Epithelial: NONE SEEN
WBC UR: 1 /HPF (ref 0–5)

## 2011-01-30 LAB — TSH: Thyroid Stimulating Horm: 5.81 u[IU]/mL — ABNORMAL HIGH

## 2011-01-30 LAB — ACETAMINOPHEN LEVEL: Acetaminophen: 4 ug/mL — ABNORMAL LOW

## 2011-02-01 ENCOUNTER — Ambulatory Visit: Admitting: Physical Medicine & Rehabilitation

## 2011-02-01 ENCOUNTER — Encounter: Attending: Physical Medicine & Rehabilitation

## 2011-02-01 DIAGNOSIS — E119 Type 2 diabetes mellitus without complications: Secondary | ICD-10-CM | POA: Insufficient documentation

## 2011-02-01 DIAGNOSIS — F19939 Other psychoactive substance use, unspecified with withdrawal, unspecified: Secondary | ICD-10-CM | POA: Insufficient documentation

## 2011-02-01 DIAGNOSIS — F1120 Opioid dependence, uncomplicated: Secondary | ICD-10-CM | POA: Insufficient documentation

## 2011-02-01 DIAGNOSIS — M545 Low back pain: Secondary | ICD-10-CM

## 2011-02-01 DIAGNOSIS — Z79899 Other long term (current) drug therapy: Secondary | ICD-10-CM | POA: Insufficient documentation

## 2011-02-01 DIAGNOSIS — M47817 Spondylosis without myelopathy or radiculopathy, lumbosacral region: Secondary | ICD-10-CM | POA: Insufficient documentation

## 2011-02-01 DIAGNOSIS — Z794 Long term (current) use of insulin: Secondary | ICD-10-CM | POA: Insufficient documentation

## 2011-02-01 NOTE — Assessment & Plan Note (Signed)
A 46 year old male who I have known and treated for several years for lumbar spondylosis.  He has been receiving tramadol ER 300 mg through this office.  In addition, through the [place], he had been taking oxycodone. He has been on Percocet as many as 12 tablets per day in the past and now is down to 6 to 8.  He feels like this medication has given him mood swings, has made his diabetic control more difficult.  His wife is here with him stating that he has had a change in personality over time since being on pain medicines.  He wants to get off these medications, [facility] kind [facility] but when he showed up there at the ER on Friday he was told that somebody gave him wrong information.  He had already thrown away his oxycodone.  The ER doctor gave him some hydrocodone, but the patient finished these on Sunday.  He has had no Ultram ER either.  Other meds include insulin pump.  He is also on levothyroxine.  He is on simvastatin and lisinopril.  ALLERGIES:  None known.  Last RF at December 25, 2008 on the left side.  On the right side, October,11, 2010.  PHYSICAL EXAMINATION:  MUSCULOSKELETAL:  Back has no tenderness to palpation.  He is sweating even though it is a cold day.  His hip range of motion is full.  Knee and ankle range of motion is full.  Deep tendon reflexes are normal.  Strength is normal in lower extremities.  Back pain is 2-3/10. NEUROLOGIC:  Gait is normal.  Mood and affect are labile but has otherwise without evidence of agitation. VITAL SIGNS:  Blood pressure 140/102, pulse 107, respiratory rate 16, O2 saturation 98% on room air.  Weight 240 pounds, height 6 feet 1 inch.  IMPRESSION: 1. Lumbar spondylosis.  He has had good results with radiofrequency.     He really has not done any physical therapy for a while.  He has     had good results also with tramadol in terms of oral medications. 2. Narcotic dependence with abrupt  cessation of opiate treatment.  He     is going through withdrawal currently, sweats as well as overall     flu-like feeling.  Fortunately, he was on the moderate dose of     opiates on and short-acting, so I think this will not be a     prolonged process.  I have written for some clonidine 0.1 b.i.d.     We will send him to the [facility]. 3. I will see him back in 2 weeks. 4. I will write some FMLA for him.  Discussed with the patient and     wife, agree with plan.     Erick Colace, M.D. Electronically Signed    AEK/MedQ D:  02/01/2011 14:05:15  T:  02/01/2011 21:25:50  Job #:  [id number]

## 2011-02-15 ENCOUNTER — Ambulatory Visit: Admitting: Physical Medicine & Rehabilitation

## 2011-02-15 ENCOUNTER — Encounter: Attending: Physical Medicine & Rehabilitation

## 2011-02-15 DIAGNOSIS — M47817 Spondylosis without myelopathy or radiculopathy, lumbosacral region: Secondary | ICD-10-CM | POA: Insufficient documentation

## 2011-02-15 DIAGNOSIS — Z79899 Other long term (current) drug therapy: Secondary | ICD-10-CM | POA: Insufficient documentation

## 2011-02-15 DIAGNOSIS — F1120 Opioid dependence, uncomplicated: Secondary | ICD-10-CM | POA: Insufficient documentation

## 2011-02-15 DIAGNOSIS — Z794 Long term (current) use of insulin: Secondary | ICD-10-CM | POA: Insufficient documentation

## 2011-02-15 DIAGNOSIS — F19939 Other psychoactive substance use, unspecified with withdrawal, unspecified: Secondary | ICD-10-CM | POA: Insufficient documentation

## 2011-02-15 DIAGNOSIS — E119 Type 2 diabetes mellitus without complications: Secondary | ICD-10-CM | POA: Insufficient documentation

## 2011-02-15 NOTE — Assessment & Plan Note (Signed)
REASON FOR VISIT:  Back pain.  A 46 year old male with lumbar spondylosis, receiving tramadol through this office 300 mg a day, but also was taking oxycodone through the [place] 6- 8 tablets a day, decided to go through detox for this, went through the [facility].  He has had some recurrence of his back pain, since coming off his medications.  He has not started back on any type of pain medicine.  He has had inter current illness of bronchitis.  His blood sugars when out of control when he received some steroids orally.  He has had Oswestry score of 70% today.  His pain scores, however, are stable compared to February 01, 2011, at 4 on average and 7 currently. His pain does come and go and increases when he is inactive for a period of time.  It is better when he is up and moving.  His pain does radiate into his thighs posteriorly.  Blood pressure 141/95, pulse 101, respiratory rate is 18 and O2 sat 96% on room air.  Weight 250 pounds, height 6 feet 1 inch.  Well-developed, well-nourished male, in no acute distress.  Mood and affect without agitation, appears anxious, however.  His strength is normal in the lower extremities.  Deep tendon reflexes are normal.  Knee, hip and ankle range of motion are full.  His straight leg raising test on the right side causes some hamstring pain.  IMPRESSION:  Back pain.  He has had a combination of both lumbar spondylosis as well as SI problems.  He has had good results with SI injections in the past, although they have been short lived.  He has had good results with radiofrequency as well.  He has not had any physical therapy for a while.  PLAN:  I have come up with a nonnarcotic treatment alternative for him. This will include tramadol 50 mg t.i.d., Mobic 15 mg daily in substitution for his Aleve.  We will start him on therapies stabilization program over at [facility] location.  In addition, we will do injection of Toradol 60 mg IM  today.  I have written him out for 2 additional days.  He has one more appointment at [facility] tomorrow.  I would like him to resume his usual duties the day after.  I discussed with the patient, agrees with plan.  I will see him back in 1-2 weeks for the SI injection and if that fails we will need to schedule some medial branch blocks.     Erick Colace, M.D. Electronically Signed    AEK/MedQ D:  02/15/2011 14:08:37  T:  02/15/2011 21:36:53  Job #:  [id number]

## 2011-02-24 ENCOUNTER — Ambulatory Visit: Admitting: Physical Medicine & Rehabilitation

## 2011-02-24 ENCOUNTER — Encounter

## 2011-02-24 DIAGNOSIS — M47817 Spondylosis without myelopathy or radiculopathy, lumbosacral region: Secondary | ICD-10-CM

## 2011-02-25 NOTE — Procedures (Signed)
Connor Holt, Connor Holt          ACCOUNT NO.:  [id number]  MEDICAL RECORD NO.:  [id number]           PATIENT TYPE:  O  LOCATION:  [facility]                          FACILITY:  [facility]  PHYSICIAN:  Erick Colace, M.D.DATE OF BIRTH:  06/08/1965  DATE OF PROCEDURE:  02/24/2011 DATE OF DISCHARGE:                              OPERATIVE REPORT  PROCEDURE:  Bilateral L5 dorsal ramus injection, bilateral L4 medial branch block, bilateral L3 medial branch block under fluoroscopic guidance.  INDICATION:  Lumbar pain.  Pain is only partially responsive to medication management and other conservative care.  He has had good relief with radiofrequency in the past, however, last time it was done was in 2010.  He is here for reassessment with bilateral medial branch blocks.  Informed consent was obtained after describing risks and benefits of the procedure with the patient.  These include bleeding, bruising, and infection.  He elects to proceed and has given written consent.  The patient was placed prone on fluoroscopy table.  Betadine prep, sterile drape, 25-gauge inch and half needle was used to anesthetize the skin and subcutaneous tissue with 1% lidocaine 1.5 mL each of 6 sites.  Then, a 22-gauge 3.5-inch spinal needle was inserted under fluoroscopic guidance, first starting at left S1 SAP sacral ala junction, bone contact made.  Omnipaque 180 x0.5 mL demonstrated no intravascular uptake.  Then, 0.5 mL solution containing 0.5% bupivacaine was injected. Then, the left L5 SAP transverse process junction targeted, bone contact made.  Omnipaque 180 x0.5 mL demonstrated no intravascular uptake. Then, 0.5 mL of the 0.5% bupivacaine solution was injected.  Then, left L4 SAP transverse process junction targeted, bone contact made. Omnipaque 180 x0.5 mL demonstrated no intravascular uptake and 0.5 mL of the bupivacaine solution was injected.  The same procedure was repeated on the right side using  same needle, injectate, and technique.  The patient tolerated the procedure well.  Pre and post injection vitals stable.  Post injection instructions given.  Pre injection pain 7/10, Post injection pain 0/10     Erick Colace, M.D. Electronically Signed    AEK/MEDQ  D:  02/24/2011 13:15:01  T:  02/25/2011 00:18:42  Job:  [id number]

## 2011-02-28 ENCOUNTER — Encounter

## 2011-02-28 ENCOUNTER — Ambulatory Visit: Admitting: Physical Medicine & Rehabilitation

## 2011-02-28 DIAGNOSIS — M47817 Spondylosis without myelopathy or radiculopathy, lumbosacral region: Secondary | ICD-10-CM

## 2011-02-28 NOTE — Assessment & Plan Note (Signed)
Connor Holt returns today.  I saw him last week for lumbar medial branch block at L3-4 medial branches, L5 dorsal ramus.  He had 100% relief for 2-3 hours after the lidocaine injection and then had recurrence of pain that he thought was worse than before.  He went back to the [place] and was started back on the Percocet 5 mg 2 tablets 3 times a day.  He has not started his physical therapy yet.  He tried for about 10 days on a nonnarcotic treatment program, which consisted of tramadol, Mobic before he went back to get started on his narcotic analgesics again.  His pain level is about 4/10, compared with 7 when I saw him before the injection and 6 that he reported on February 24, 2011.  He has had no bowel or bladder dysfunction.  No radiating pain.  He does have his chronic right groin pain related to hip issue.  His work hours are still 40 hours a week, although he missed quite a bit with all his medical care.  PHYSICAL EXAMINATION:  VITAL SIGNS:  His blood pressure 163/80, pulse 95, respirations 16, O2 sat 96% on room air, weight 247 pounds, height 6 feet 1 inches. MUSCULOSKELETAL:  His back has no tenderness to palpation.  He has some chronic burn marks from his heating pad. Straight leg raising test is negative.  Hip, knee, ankle range of motion is full.  Deep tendon reflexes are normal.  IMPRESSION:  Lumbar spondylosis with chronic pain.  I think he has an anxiety component as well, but certainly has responded well to medial branch blocks for the expected duration of the local anesthetic.  I would recommend repeating radiofrequency first on the right side, then a month later on the left side.  I would follow that up with some physical therapy.  Then, I would focus on trying to wean the Percocet once again. We discussed possible benefit of cymbalta for pain and mood.  He declines doesn't want anything that may change the way he "feels or acts". Discussed with patient, agrees with  plan.  In terms of his elevated blood pressure, he needs to follow up with his primary physician, although some of this is stress and pain related.     Erick Colace, M.D. Electronically Signed    AEK/MedQ D:  02/28/2011 11:40:10  T:  02/28/2011 19:54:09  Job #:  [id number]

## 2011-03-01 ENCOUNTER — Ambulatory Visit: Admitting: Rehabilitative and Restorative Service Providers"

## 2011-03-09 ENCOUNTER — Ambulatory Visit: Admitting: Rehabilitative and Restorative Service Providers"

## 2011-03-21 ENCOUNTER — Ambulatory Visit (HOSPITAL_BASED_OUTPATIENT_CLINIC_OR_DEPARTMENT_OTHER): Admitting: Physical Medicine & Rehabilitation

## 2011-03-21 ENCOUNTER — Encounter: Payer: Self-pay | Admitting: Physical Medicine & Rehabilitation

## 2011-03-21 ENCOUNTER — Encounter: Attending: Physical Medicine & Rehabilitation

## 2011-03-21 VITALS — BP 134/89 | HR 84 | Resp 16 | Ht 73.0 in | Wt 250.6 lb

## 2011-03-21 DIAGNOSIS — F1120 Opioid dependence, uncomplicated: Secondary | ICD-10-CM | POA: Insufficient documentation

## 2011-03-21 DIAGNOSIS — Z79899 Other long term (current) drug therapy: Secondary | ICD-10-CM | POA: Insufficient documentation

## 2011-03-21 DIAGNOSIS — M47817 Spondylosis without myelopathy or radiculopathy, lumbosacral region: Secondary | ICD-10-CM | POA: Insufficient documentation

## 2011-03-21 DIAGNOSIS — E119 Type 2 diabetes mellitus without complications: Secondary | ICD-10-CM | POA: Insufficient documentation

## 2011-03-21 DIAGNOSIS — Z794 Long term (current) use of insulin: Secondary | ICD-10-CM | POA: Insufficient documentation

## 2011-03-21 DIAGNOSIS — F19939 Other psychoactive substance use, unspecified with withdrawal, unspecified: Secondary | ICD-10-CM | POA: Insufficient documentation

## 2011-03-21 NOTE — Progress Notes (Signed)
RightL5 dorsal ramus., Right L4 and Right L3 medial branch radio frequency neuropathy under fluoroscopic guidance   Indication: Low back pain due to lumbar spondylosis which has been relieved on 2 occasions by greater than 50% by lumbar medial branch blocks at corresponding levels.  Informed consent was obtained after describing risks and benefits of the procedure with the patient, this includes bleeding, bruising, infection, paralysis and medication side effects. The patient wishes to proceed and has given written consent. The patient was placed in a prone position. The lumbar and sacral area was marked and prepped with Betadine. A 25-gauge 1-1/2 inch needle was inserted into the skin and subcutaneous tissue at 3 sites in one ML of 1% lidocaine was injected into each site. Then a 20-gauge 10 cm radio frequency needle with a 1 cm curved active tip was inserted targeting the Right S1 SAP/sacral ala junction. Bone contact was made and confirmed with lateral imaging. Sensory stimulation at 50 Hz followed by motor stimulation at 2 Hz confirm proper needle location followed by injection of one ML of the solution containing one ML of 4 mg per mL dexamethasone and 3 mL of 1% MPF lidocaine. Then the Right L5 SAP/transverse process junction was targeted. Bone contact was made and confirmed with lateral imaging. Sensory stimulation at 50 Hz followed by motor stimulation at 2 Hz confirm proper needle location followed by injection of one ML of the solution containing one ML of 4 mg per mL dexamethasone and 3 mL of 1% MPF lidocaine. Then the Right L4 SAP/transverse process junction was targeted. Bone contact was made and confirmed with lateral imaging. Sensory stimulation at 50 Hz followed by motor stimulation at 2 Hz confirm proper needle location followed by injection of one ML of the solution containing one ML of 4 mg per mL dexamethasone and 3 mL of 1% MPF lidocaine. Radio frequency lesion being at 80C for 90 seconds  was performed. Needles were removed. Post procedure instructions and vital signs were performed. Patient tolerated procedure well. Followup appointment was given. 

## 2011-03-21 NOTE — Patient Instructions (Signed)
Schedule L side next month

## 2011-03-21 NOTE — Progress Notes (Signed)
  PROCEDURE RECORD [facility]   Name: Connor Holt DOB:1965/12/07 MRN: [id number]  Date:03/21/2011  Physician: Claudette Laws, MD    Nurse/CMA: Shumaker RN  Allergies: No Known Allergies  Consent Signed: yes  Is patient diabetic? yes  CBG today? 83  Pregnant: no LMP: No LMP for male patient. (age 44-55)n/a  Anticoagulants: no Anti-inflammatory: no Antibiotics: no  Procedure: R Lumbar RF Position: Prone Start Time:11:53  End Time: 12:10 Fluoro Time: 25sec  RN/CMA Haematologist RN    Time 11:22 12:17    BP 134/89 126/84    Pulse 84 86    Respirations 16 16    O2 Sat 98% 98%    S/S 6 6    Pain Level 5/10 4/10     D/C home with wife [name], patient A & O X 3, D/C instructions reviewed, and sits independently.

## 2011-04-06 ENCOUNTER — Telehealth: Payer: Self-pay | Admitting: Physical Medicine & Rehabilitation

## 2011-04-06 ENCOUNTER — Other Ambulatory Visit: Payer: Self-pay | Admitting: *Deleted

## 2011-04-06 MED ORDER — GLUCOSE BLOOD VI STRP: ORAL_STRIP | Status: DC

## 2011-04-06 NOTE — Telephone Encounter (Signed)
Should pt keep appointment for 2nd injection?

## 2011-04-06 NOTE — Telephone Encounter (Signed)
Has appointment for 2nd injection on 04/25/11.  Patient did not get enough relief form 1st injection to have the 2nd injection.  Please call.  [name] - [phone] or Thayer Ohm - [phone]

## 2011-04-06 NOTE — Telephone Encounter (Signed)
R'cd fax from [facility] for refill of Onetouch Ultra Test Strips

## 2011-04-07 NOTE — Telephone Encounter (Signed)
Pt aware to keep appointment.

## 2011-04-07 NOTE — Telephone Encounter (Signed)
We can make it a f/u appt to review options

## 2011-04-18 ENCOUNTER — Encounter: Payer: Self-pay | Admitting: Physical Medicine & Rehabilitation

## 2011-04-19 ENCOUNTER — Telehealth: Payer: Self-pay | Admitting: Physical Medicine & Rehabilitation

## 2011-04-19 NOTE — Telephone Encounter (Signed)
Patient requesting 90 day refill on Tramadol.  They need authorization, NPI, directions called in to [phone] - ref #[id number] or fax to [phone]

## 2011-04-20 MED ORDER — TRAMADOL HCL ER 300 MG PO TB24: 300.0000 mg | ORAL_TABLET | Freq: Every day | ORAL | Status: DC

## 2011-04-20 NOTE — Telephone Encounter (Signed)
Rx has been sent into [facility].

## 2011-04-25 ENCOUNTER — Ambulatory Visit: Admitting: Physical Medicine & Rehabilitation

## 2011-05-06 ENCOUNTER — Encounter: Payer: Self-pay | Admitting: Physical Medicine & Rehabilitation

## 2011-05-06 ENCOUNTER — Ambulatory Visit (HOSPITAL_BASED_OUTPATIENT_CLINIC_OR_DEPARTMENT_OTHER): Admitting: Physical Medicine & Rehabilitation

## 2011-05-06 ENCOUNTER — Encounter: Attending: Physical Medicine & Rehabilitation

## 2011-05-06 VITALS — BP 131/78 | HR 87 | Ht 73.0 in | Wt 249.0 lb

## 2011-05-06 DIAGNOSIS — M47817 Spondylosis without myelopathy or radiculopathy, lumbosacral region: Secondary | ICD-10-CM

## 2011-05-06 DIAGNOSIS — Z794 Long term (current) use of insulin: Secondary | ICD-10-CM | POA: Insufficient documentation

## 2011-05-06 DIAGNOSIS — E119 Type 2 diabetes mellitus without complications: Secondary | ICD-10-CM | POA: Insufficient documentation

## 2011-05-06 DIAGNOSIS — F19939 Other psychoactive substance use, unspecified with withdrawal, unspecified: Secondary | ICD-10-CM | POA: Insufficient documentation

## 2011-05-06 DIAGNOSIS — F1120 Opioid dependence, uncomplicated: Secondary | ICD-10-CM | POA: Insufficient documentation

## 2011-05-06 DIAGNOSIS — Z79899 Other long term (current) drug therapy: Secondary | ICD-10-CM | POA: Insufficient documentation

## 2011-05-06 NOTE — Patient Instructions (Signed)
Back Exercises Back exercises help treat and prevent back injuries. The goal of back exercises is to increase the strength of your abdominal and back muscles and the flexibility of your back. These exercises should be started when you no longer have back pain. Back exercises include:  Pelvic Tilt. Lie on your back with your knees bent. Tilt your pelvis until the lower part of your back is against the floor. Hold this position 5 to 10 sec and repeat 5 to 10 times.   Knee to Chest. Pull first 1 knee up against your chest and hold for 20 to 30 seconds, repeat this with the other knee, and then both knees. This may be done with the other leg straight or bent, whichever feels better.   Sit-Ups or Curl-Ups. Bend your knees 90 degrees. Start with tilting your pelvis, and do a partial, slow sit-up, lifting your trunk only 30 to 45 degrees off the floor. Take at least 2 to 3 seconds for each sit-up. Do not do sit-ups with your knees out straight. If partial sit-ups are difficult, simply do the above but with only tightening your abdominal muscles and holding it as directed.   Hip-Lift. Lie on your back with your knees flexed 90 degrees. Push down with your feet and shoulders as you raise your hips a couple inches off the floor; hold for 10 seconds, repeat 5 to 10 times.   Back arches. Lie on your stomach, propping yourself up on bent elbows. Slowly press on your hands, causing an arch in your low back. Repeat 3 to 5 times. Any initial stiffness and discomfort should lessen with repetition over time.   Shoulder-Lifts. Lie face down with arms beside your body. Keep hips and torso pressed to floor as you slowly lift your head and shoulders off the floor.  Do not overdo your exercises, especially in the beginning. Exercises may cause you some mild back discomfort which lasts for a few minutes; however, if the pain is more severe, or lasts for more than 15 minutes, do not continue exercises until you see your  caregiver. Improvement with exercise therapy for back problems is slow.  See your caregivers for assistance with developing a proper back exercise program. Document Released: 02/04/2004 Document Revised: 12/16/2010 Document Reviewed: 12/27/2004 Exit[employer] Patient Information 2012 [employer]. 

## 2011-05-06 NOTE — Progress Notes (Signed)
  Subjective:    Patient ID: Connor Holt, male    DOB: April 09, 1965, 46 y.o.   MRN: [id number]  HPI  Pain Inventory Average Pain 5 Pain Right Now 3 My pain is intermittent, burning, dull, stabbing and tingling  In the last 24 hours, has pain interfered with the following? General activity 4 Relation with others 4 Enjoyment of life 5 What TIME of day is your pain at its worst? morning and night Sleep (in general) Poor  Pain is worse with: bending, sitting, inactivity, standing and some activites Pain improves with: rest, heat/ice, therapy/exercise, pacing activities and medication Relief from Meds: 6  Mobility walk without assistance how many minutes can you walk? 20 min ability to climb steps?  yes do you drive?  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  no  Physicians involved in your care Any changes since last visit?  no       Review of Systems  All other systems reviewed and are negative.       Objective:   Physical Exam        Assessment & Plan:

## 2011-06-02 ENCOUNTER — Telehealth: Payer: Self-pay | Admitting: Physical Medicine & Rehabilitation

## 2011-06-02 NOTE — Telephone Encounter (Signed)
Pt aware that Dr. Wynn Banker can see him during or after PT. Advised to call back and reschedule his appointment.

## 2011-06-02 NOTE — Telephone Encounter (Signed)
Patient has not been able to receive PT yet.  Will start 06/07/11.  Does Dr want to see him before, during or after PT?  Cx todays appt.

## 2011-06-03 ENCOUNTER — Encounter

## 2011-06-03 ENCOUNTER — Ambulatory Visit: Admitting: Physical Medicine & Rehabilitation

## 2011-06-07 ENCOUNTER — Ambulatory Visit: Admitting: Physical Therapy

## 2011-07-05 ENCOUNTER — Encounter: Payer: Self-pay | Admitting: Physical Medicine & Rehabilitation

## 2011-07-05 ENCOUNTER — Encounter: Attending: Physical Medicine & Rehabilitation

## 2011-07-05 ENCOUNTER — Ambulatory Visit (HOSPITAL_BASED_OUTPATIENT_CLINIC_OR_DEPARTMENT_OTHER): Admitting: Physical Medicine & Rehabilitation

## 2011-07-05 VITALS — BP 134/80 | HR 96 | Resp 16 | Ht 73.0 in | Wt 253.2 lb

## 2011-07-05 DIAGNOSIS — Z794 Long term (current) use of insulin: Secondary | ICD-10-CM | POA: Insufficient documentation

## 2011-07-05 DIAGNOSIS — F19939 Other psychoactive substance use, unspecified with withdrawal, unspecified: Secondary | ICD-10-CM | POA: Insufficient documentation

## 2011-07-05 DIAGNOSIS — Z79899 Other long term (current) drug therapy: Secondary | ICD-10-CM | POA: Insufficient documentation

## 2011-07-05 DIAGNOSIS — M47817 Spondylosis without myelopathy or radiculopathy, lumbosacral region: Secondary | ICD-10-CM | POA: Insufficient documentation

## 2011-07-05 DIAGNOSIS — M47816 Spondylosis without myelopathy or radiculopathy, lumbar region: Secondary | ICD-10-CM | POA: Insufficient documentation

## 2011-07-05 DIAGNOSIS — F1120 Opioid dependence, uncomplicated: Secondary | ICD-10-CM | POA: Insufficient documentation

## 2011-07-05 DIAGNOSIS — E119 Type 2 diabetes mellitus without complications: Secondary | ICD-10-CM | POA: Insufficient documentation

## 2011-07-05 MED ORDER — TRAMADOL HCL ER 300 MG PO TB24: 300.0000 mg | ORAL_TABLET | Freq: Every day | ORAL | Status: DC

## 2011-07-05 NOTE — Progress Notes (Signed)
  Subjective:    Patient ID: Connor Holt, male    DOB: 10-Jun-1965, 46 y.o.   MRN: [id number]  HPI No new issues since the last visit other than having had a bronchitis for over one month. He has been less active and has gained weight. His back is more sore. He was unable to initiate the physical therapy that I ordered over one month ago. Pain Inventory Average Pain 4 Pain Right Now 4 My pain is intermittent, burning, dull, stabbing and aching  In the last 24 hours, has pain interfered with the following? General activity 4 Relation with others 3 Enjoyment of life 3 What TIME of day is your pain at its worst? morning and evening Sleep (in general) Poor  Pain is worse with: bending, sitting, inactivity, standing and some activites Pain improves with: rest, heat/ice, therapy/exercise and medication Relief from Meds: 6  Mobility walk without assistance how many minutes can you walk? 10-15 ability to climb steps?  yes do you drive?  yes  Function employed # of hrs/week 40  Neuro/Psych No problems in this area  Prior Studies Any changes since last visit?  no  Physicians involved in your care Any changes since last visit?  no   Family History  Problem Relation Age of Onset  . Diabetes Father    History   Social History  . Marital Status: Married    Spouse Name: N/A    Number of Children: N/A  . Years of Education: N/A   Social History Main Topics  . Smoking status: Never Smoker   . Smokeless tobacco: Current User    Types: Snuff  . Alcohol Use: None  . Drug Use: No  . Sexually Active: None   Other Topics Concern  . None   Social History Narrative  . None   History reviewed. No pertinent past surgical history. Past Medical History  Diagnosis Date  . Anxiety   . Diabetes mellitus   . Low back pain   . Dyslipidemia   . Hypothyroidism    Ht 6\' 1"  (1.854 m)  Wt 253 lb 3.2 oz (114.851 kg)  BMI 33.41 kg/m2  BP 134/80  P 96 R 16 O2sat  97   Review of Systems  Musculoskeletal: Positive for back pain.  All other systems reviewed and are negative.       Objective:   Physical Exam  Constitutional: He is oriented to person, place, and time. He appears well-developed and well-nourished.  Musculoskeletal:       Right hip: Normal.       Left hip: Normal.       Lumbar back: He exhibits decreased range of motion, tenderness and pain.  Neurological: He is alert and oriented to person, place, and time. He has normal strength. Coordination and gait normal.  Psychiatric: He has a normal mood and affect.          Assessment & Plan:  1. Lumbar spondylosis chronic low back pain. He receives tramadol 300 mg from this office and gets oxycodone from the [place]. He has responded well to medial branch blocks in the past as well as lumbar radiofrequency however the last procedure performed and prepped for a was not effective. We will send him to physical therapy to do lumbar flexion exercises. I'll see him back in 6 months or sooner if need be. No other change to treatment.

## 2011-07-05 NOTE — Patient Instructions (Signed)
Resume physical therapy Order for tramadol extended release [facility]

## 2011-07-25 ENCOUNTER — Ambulatory Visit: Attending: Physical Medicine & Rehabilitation

## 2011-07-25 DIAGNOSIS — M545 Low back pain, unspecified: Secondary | ICD-10-CM | POA: Insufficient documentation

## 2011-07-25 DIAGNOSIS — R293 Abnormal posture: Secondary | ICD-10-CM | POA: Insufficient documentation

## 2011-07-25 DIAGNOSIS — IMO0001 Reserved for inherently not codable concepts without codable children: Secondary | ICD-10-CM | POA: Insufficient documentation

## 2011-07-25 DIAGNOSIS — R5381 Other malaise: Secondary | ICD-10-CM | POA: Insufficient documentation

## 2011-07-25 DIAGNOSIS — M2560 Stiffness of unspecified joint, not elsewhere classified: Secondary | ICD-10-CM | POA: Insufficient documentation

## 2011-08-01 ENCOUNTER — Ambulatory Visit

## 2011-08-04 ENCOUNTER — Encounter

## 2011-08-09 ENCOUNTER — Encounter: Admitting: Physical Therapy

## 2011-08-11 ENCOUNTER — Ambulatory Visit: Attending: Endocrinology | Admitting: Physical Therapy

## 2011-08-11 DIAGNOSIS — IMO0001 Reserved for inherently not codable concepts without codable children: Secondary | ICD-10-CM | POA: Insufficient documentation

## 2011-08-11 DIAGNOSIS — R293 Abnormal posture: Secondary | ICD-10-CM | POA: Insufficient documentation

## 2011-08-11 DIAGNOSIS — M545 Low back pain, unspecified: Secondary | ICD-10-CM | POA: Insufficient documentation

## 2011-08-11 DIAGNOSIS — R5381 Other malaise: Secondary | ICD-10-CM | POA: Insufficient documentation

## 2011-08-11 DIAGNOSIS — M2560 Stiffness of unspecified joint, not elsewhere classified: Secondary | ICD-10-CM | POA: Insufficient documentation

## 2011-08-23 ENCOUNTER — Ambulatory Visit

## 2011-08-25 ENCOUNTER — Encounter: Admitting: Physical Therapy

## 2011-08-30 ENCOUNTER — Ambulatory Visit: Admitting: Physical Therapy

## 2011-09-01 ENCOUNTER — Ambulatory Visit: Admitting: Physical Therapy

## 2011-09-05 ENCOUNTER — Other Ambulatory Visit: Payer: Self-pay | Admitting: Endocrinology

## 2011-09-13 ENCOUNTER — Ambulatory Visit: Attending: Endocrinology

## 2011-09-13 ENCOUNTER — Other Ambulatory Visit: Payer: Self-pay | Admitting: Physical Medicine & Rehabilitation

## 2011-09-13 DIAGNOSIS — M545 Low back pain, unspecified: Secondary | ICD-10-CM | POA: Insufficient documentation

## 2011-09-13 DIAGNOSIS — R5381 Other malaise: Secondary | ICD-10-CM | POA: Insufficient documentation

## 2011-09-13 DIAGNOSIS — R293 Abnormal posture: Secondary | ICD-10-CM | POA: Insufficient documentation

## 2011-09-13 DIAGNOSIS — M2560 Stiffness of unspecified joint, not elsewhere classified: Secondary | ICD-10-CM | POA: Insufficient documentation

## 2011-09-13 DIAGNOSIS — IMO0001 Reserved for inherently not codable concepts without codable children: Secondary | ICD-10-CM | POA: Insufficient documentation

## 2011-09-15 ENCOUNTER — Encounter

## 2011-09-20 ENCOUNTER — Ambulatory Visit

## 2011-09-22 ENCOUNTER — Ambulatory Visit: Admitting: Physical Therapy

## 2011-11-16 ENCOUNTER — Telehealth: Payer: Self-pay | Admitting: Physical Medicine & Rehabilitation

## 2011-11-16 NOTE — Telephone Encounter (Signed)
Refill on Tramadol thru [facility].  Can Dr write for test strips?

## 2011-11-17 MED ORDER — TRAMADOL HCL ER 300 MG PO TB24: 300.0000 mg | ORAL_TABLET | Freq: Every day | ORAL | Status: DC

## 2011-11-17 NOTE — Telephone Encounter (Signed)
[facility], test strips denied told to contact pcp.

## 2011-11-28 ENCOUNTER — Telehealth: Payer: Self-pay | Admitting: *Deleted

## 2011-11-28 NOTE — Telephone Encounter (Signed)
Received call from Fleet Contras with [facility]. Patient is requesting refill on One touch Ultra test strips. Called and advised Britta Mccreedy with [facility] that we do not Rx this for the patient. Would need to contact PCP for refills.

## 2011-12-26 ENCOUNTER — Encounter: Attending: Physical Medicine & Rehabilitation

## 2011-12-26 ENCOUNTER — Encounter: Payer: Self-pay | Admitting: Physical Medicine & Rehabilitation

## 2011-12-26 ENCOUNTER — Ambulatory Visit (HOSPITAL_BASED_OUTPATIENT_CLINIC_OR_DEPARTMENT_OTHER): Admitting: Physical Medicine & Rehabilitation

## 2011-12-26 VITALS — BP 129/83 | HR 100 | Resp 16 | Ht 73.0 in | Wt 242.0 lb

## 2011-12-26 DIAGNOSIS — E785 Hyperlipidemia, unspecified: Secondary | ICD-10-CM | POA: Insufficient documentation

## 2011-12-26 DIAGNOSIS — E119 Type 2 diabetes mellitus without complications: Secondary | ICD-10-CM | POA: Insufficient documentation

## 2011-12-26 DIAGNOSIS — M545 Low back pain, unspecified: Secondary | ICD-10-CM | POA: Insufficient documentation

## 2011-12-26 DIAGNOSIS — M47817 Spondylosis without myelopathy or radiculopathy, lumbosacral region: Secondary | ICD-10-CM | POA: Insufficient documentation

## 2011-12-26 DIAGNOSIS — E039 Hypothyroidism, unspecified: Secondary | ICD-10-CM | POA: Insufficient documentation

## 2011-12-26 DIAGNOSIS — G894 Chronic pain syndrome: Secondary | ICD-10-CM

## 2011-12-26 NOTE — Patient Instructions (Addendum)
200 lb goal for next visit

## 2011-12-26 NOTE — Progress Notes (Signed)
  Subjective:    Patient ID: Connor Holt, male    DOB: Apr 10, 1965, 46 y.o.   MRN: [id number]  HPI Exercising more, losing weight, pain overall better control. Taking less oxycodone. Gets oxycodone from [place]. Continues on tramadol ER through this office Has gone through physical therapy over the summer. Exercise was reduced because of shin splints which have improved now.   Pain Inventory Average Pain 5 Pain Right Now 3 My pain is intermittent, burning, dull, tingling and aching  In the last 24 hours, has pain interfered with the following? General activity 4 Relation with others 4 Enjoyment of life 3 What TIME of day is your pain at its worst? all the time Sleep (in general) Fair  Pain is worse with: bending, sitting, inactivity, standing and some activites Pain improves with: rest, heat/ice, therapy/exercise, pacing activities and medication Relief from Meds: 7  Mobility walk without assistance how many minutes can you walk? 20 ability to climb steps?  yes do you drive?  yes Do you have any goals in this area?  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  no  Physicians involved in your care Any changes since last visit?  no   Family History  Problem Relation Age of Onset  . Diabetes Father    History   Social History  . Marital Status: Married    Spouse Name: N/A    Number of Children: N/A  . Years of Education: N/A   Social History Main Topics  . Smoking status: Never Smoker   . Smokeless tobacco: Current User    Types: Snuff  . Alcohol Use: None  . Drug Use: No  . Sexually Active: None   Other Topics Concern  . None   Social History Narrative  . None   History reviewed. No pertinent past surgical history. Past Medical History  Diagnosis Date  . Anxiety   . Diabetes  mellitus   . Low back pain   . Dyslipidemia   . Hypothyroidism    BP 129/83  Pulse 100  Resp 16  Ht 6\' 1"  (1.854 m)  Wt 242 lb (109.77 kg)  BMI 31.93 kg/m2  SpO2 94%    Review of Systems  Musculoskeletal: Positive for back pain.  All other systems reviewed and are negative.       Objective:   Physical Exam  Constitutional: He is oriented to person, place, and time. He appears well-developed and well-nourished.  Musculoskeletal:  Right hip: Normal.  Left hip: Normal.  Lumbar back: He exhibits decreased range of motion, tenderness and pain.  Neurological: He is alert and oriented to person, place, and time. He has normal strength. Coordination and gait normal.  Psychiatric: He has a normal mood and affect       Assessment & Plan:  1. Lumbar spondylosis chronic low back pain. He receives tramadol 300 mg from this office and gets oxycodone from the [place]. I'll see him back in 6 months. We discussed that if he develops shin splints it may be from increasing running distance too rapidly.  Relative rest with bicycling would be helpful

## 2012-02-17 ENCOUNTER — Ambulatory Visit
Admission: RE | Admit: 2012-02-17 | Discharge: 2012-02-17 | Disposition: A | Discharge status: Home / Self Care | Source: Ambulatory Visit | Attending: Endocrinology | Admitting: Endocrinology

## 2012-02-17 ENCOUNTER — Ambulatory Visit (INDEPENDENT_AMBULATORY_CARE_PROVIDER_SITE_OTHER): Admitting: Endocrinology

## 2012-02-17 ENCOUNTER — Encounter: Payer: Self-pay | Admitting: Endocrinology

## 2012-02-17 VITALS — BP 124/80 | HR 86 | Temp 99.3°F | Wt 241.0 lb

## 2012-02-17 DIAGNOSIS — E109 Type 1 diabetes mellitus without complications: Secondary | ICD-10-CM

## 2012-02-17 DIAGNOSIS — R062 Wheezing: Secondary | ICD-10-CM | POA: Insufficient documentation

## 2012-02-17 DIAGNOSIS — R05 Cough: Secondary | ICD-10-CM

## 2012-02-17 MED ORDER — GLUCOSE BLOOD VI STRP: 1.0000 | ORAL_STRIP | Freq: Four times a day (QID) | Status: DC

## 2012-02-17 NOTE — Progress Notes (Signed)
  Subjective:    Patient ID: Connor Holt, male    DOB: Feb 21, 1965, 47 y.o.   MRN: [id number]  HPI Pt states few days of moderate prod-quality cough in the chest, and assoc fever.  He went to [facility], where he was rx'ed levaquin.  He says he has a home nebulizer.   Past Medical History  Diagnosis Date  . Anxiety   . Diabetes mellitus   . Low back pain   . Dyslipidemia   . Hypothyroidism     No past surgical history on file.  History   Social History  . Marital Status: Married    Spouse Name: N/A    Number of Children: N/A  . Years of Education: N/A   Occupational History  . Not on file.   Social History Main Topics  . Smoking status: Never Smoker   . Smokeless tobacco: Current User    Types: Snuff  . Alcohol Use: Not on file  . Drug Use: No  . Sexually Active: Not on file   Other Topics Concern  . Not on file   Social History Narrative  . No narrative on file    Current Outpatient Prescriptions on File Prior to Visit  Medication Sig Dispense Refill  . ALPRAZolam (XANAX) 0.25 MG tablet Take 0.25 mg by mouth at bedtime as needed.      . cholecalciferol (VITAMIN D) 1000 UNITS tablet Take 1,000 Units by mouth daily.      Marland Kitchen gabapentin (NEURONTIN) 400 MG capsule Take 400 mg by mouth 3 (three) times daily.        . insulin lispro (HUMALOG) 100 UNIT/ML injection Dosed through insulin pump, varies with intake/ continuous dosing      . levothyroxine (SYNTHROID) 125 MCG tablet Take 125 mcg by mouth daily.        Marland Kitchen lisinopril (PRINIVIL,ZESTRIL) 5 MG tablet Take 5 mg by mouth daily.        Marland Kitchen oxyCODONE-acetaminophen (PERCOCET) 5-325 MG per tablet Take 2 tablets by mouth every 8 (eight) hours as needed.      . simvastatin (ZOCOR) 40 MG tablet Take 40 mg by mouth every evening.      . traMADol (ULTRAM-ER) 300 MG 24 hr tablet Take 1 tablet (300 mg total) by mouth daily.  90 tablet  1  . vitamin B-12 (CYANOCOBALAMIN) 1000 MCG tablet Take 1,000 mcg by mouth daily. 1cc IM every  month         No Known Allergies  Family History  Problem Relation Age of Onset  . Diabetes Father     BP 124/80  Pulse 86  Temp 99.3 F (37.4 C)  Wt 241 lb (109.317 kg)  SpO2 96%    Review of Systems He has some wheezing also.  He has pain in the chest, with coughing.      Objective:   Physical Exam VITAL SIGNS:  See vs page GENERAL: no distress head: no deformity eyes: no periorbital swelling, no proptosis external nose and ears are normal mouth: no lesion seen Right tm is red.  The left is normal. LUNGS:  Clear to auscultation       Assessment & Plan:  Acute bronchitis, new Wheezing, ? asthma

## 2012-02-17 NOTE — Patient Instructions (Addendum)
Let's check a chest-x-ray today, downstairs.   Please finish the levafloxacin.  I hope you feel better soon.  If you don't feel better by next week, please call back.  Please call sooner if you get worse. Refer to a lung specialist.  you will receive a phone call, about a day and time for an appointment

## 2012-02-25 ENCOUNTER — Other Ambulatory Visit: Payer: Self-pay

## 2012-03-06 ENCOUNTER — Institutional Professional Consult (permissible substitution): Admitting: Internal Medicine

## 2012-04-10 ENCOUNTER — Encounter: Payer: Self-pay | Admitting: Physical Medicine & Rehabilitation

## 2012-04-10 ENCOUNTER — Telehealth: Payer: Self-pay | Admitting: Physical Medicine & Rehabilitation

## 2012-04-10 NOTE — Telephone Encounter (Signed)
Kashon's RF from March of 2013 is being denied by [employer].  The billing department is working on an appeal.  However, the patient is requesting that Dr. Wynn Banker write a letter on his behalf to help get a payment on this injection.

## 2012-05-14 ENCOUNTER — Telehealth: Payer: Self-pay | Admitting: *Deleted

## 2012-05-14 MED ORDER — TRAMADOL HCL ER 300 MG PO TB24: 300.0000 mg | ORAL_TABLET | Freq: Every day | ORAL | Status: DC

## 2012-05-14 NOTE — Telephone Encounter (Signed)
[facility]. Refilled and [name] notified

## 2012-06-06 ENCOUNTER — Other Ambulatory Visit: Payer: Self-pay | Admitting: Endocrinology

## 2012-06-25 ENCOUNTER — Encounter: Attending: Physical Medicine & Rehabilitation

## 2012-06-25 ENCOUNTER — Encounter: Payer: Self-pay | Admitting: Physical Medicine & Rehabilitation

## 2012-06-25 ENCOUNTER — Ambulatory Visit (HOSPITAL_BASED_OUTPATIENT_CLINIC_OR_DEPARTMENT_OTHER): Admitting: Physical Medicine & Rehabilitation

## 2012-06-25 VITALS — BP 128/81 | HR 86 | Resp 14 | Ht 73.0 in | Wt 238.6 lb

## 2012-06-25 DIAGNOSIS — Z79899 Other long term (current) drug therapy: Secondary | ICD-10-CM | POA: Insufficient documentation

## 2012-06-25 DIAGNOSIS — M47816 Spondylosis without myelopathy or radiculopathy, lumbar region: Secondary | ICD-10-CM

## 2012-06-25 DIAGNOSIS — G8929 Other chronic pain: Secondary | ICD-10-CM | POA: Insufficient documentation

## 2012-06-25 DIAGNOSIS — M47817 Spondylosis without myelopathy or radiculopathy, lumbosacral region: Secondary | ICD-10-CM

## 2012-06-25 DIAGNOSIS — M25559 Pain in unspecified hip: Secondary | ICD-10-CM | POA: Insufficient documentation

## 2012-06-25 DIAGNOSIS — E785 Hyperlipidemia, unspecified: Secondary | ICD-10-CM | POA: Insufficient documentation

## 2012-06-25 DIAGNOSIS — E039 Hypothyroidism, unspecified: Secondary | ICD-10-CM | POA: Insufficient documentation

## 2012-06-25 DIAGNOSIS — R109 Unspecified abdominal pain: Secondary | ICD-10-CM

## 2012-06-25 DIAGNOSIS — E119 Type 2 diabetes mellitus without complications: Secondary | ICD-10-CM | POA: Insufficient documentation

## 2012-06-25 NOTE — Progress Notes (Addendum)
Subjective:    Patient ID: Connor Holt, male    DOB: March 28, 1965, 47 y.o.   MRN: [id number] Exercising more, losing weight, pain overall better control. Taking less oxycodone. Gets oxycodone from [place] takes less than 6x5 mg tablets per day.. Continues on tramadol ER through this office  Has gone through physical therapy over the summer.  Working with job coach  Down 10lb due to diet changes HPI Pain around right hip area starts in the back area. Sometimes radiates to groin. Started one week ago. Initially severe. Over the last 2 days has been improving. Pain Inventory Average Pain 4 Pain Right Now 4 My pain is dull and aching  In the last 24 hours, has pain interfered with the following? General activity 3 Relation with others 3 Enjoyment of life 3 What TIME of day is your pain at its worst? morning and evening Sleep (in general) Fair  Pain is worse with: bending, sitting, inactivity, standing and some activites Pain improves with: rest, heat/ice, therapy/exercise, pacing activities and medication Relief from Meds: 6  Mobility walk without assistance how many minutes can you walk? 30  Function employed # of hrs/week 40  Neuro/Psych No problems in this area  Prior Studies Any changes since last visit?  no  Physicians involved in your care Any changes since last visit?  no   Family History  Problem Relation Age of Onset  . Diabetes Father    History   Social History  . Marital Status: Married    Spouse Name: N/A    Number of Children: N/A  . Years of Education: N/A   Social History Main Topics  . Smoking status: Never Smoker   . Smokeless tobacco: Current User    Types: Snuff  . Alcohol Use: None  . Drug Use: No  . Sexually Active: None   Other Topics Concern  . None   Social History Narrative  . None   History reviewed. No pertinent past surgical history. Past Medical History  Diagnosis Date  . Anxiety   . Diabetes mellitus   . Low back  pain   . Dyslipidemia   . Hypothyroidism    BP 128/81  Pulse 86  Resp 14  Ht 6\' 1"  (1.854 m)  Wt 238 lb 9.6 oz (108.228 kg)  BMI 31.49 kg/m2  SpO2 100%     Review of Systems  Musculoskeletal: Positive for back pain.  All other systems reviewed and are negative.       Objective:   Physical Exam  Nursing note and vitals reviewed. Constitutional: He is oriented to person, place, and time. He appears well-developed and well-nourished.  HENT:  Head: Normocephalic and atraumatic.  Eyes: Conjunctivae and EOM are normal. Pupils are equal, round, and reactive to light.  Musculoskeletal:       Right hip: He exhibits normal range of motion, normal strength, no tenderness and no deformity.       Lumbar back: He exhibits normal range of motion.  Negative straight-leg raising. Pain with internal rotation of the right hip. No tenderness in the inguinal area. No masses palpated. No tenderness over the lumbar area. No pain over the PSIS.  Neurological: He is alert and oriented to person, place, and time.  Psychiatric: He has a normal mood and affect.          Assessment & Plan:  1. Lumbar spondylosis chronic low back pain. He receives tramadol 300 mg from this office and gets oxycodone from the [place].  I'll see him back in 9months. 2. Right hip and low back pain. I believe this is muscular. He has a history of obturator externus tear, may have had some recurrence however it is getting better already. Patient is called the worsens. Can reevaluate  Over half of the 25 min visit was spent counseling and coordinating care.

## 2012-06-25 NOTE — Patient Instructions (Signed)
Call if hip and buttock pain worsened

## 2012-09-07 ENCOUNTER — Telehealth: Payer: Self-pay

## 2012-09-07 MED ORDER — TRAMADOL HCL ER 300 MG PO TB24: 300.0000 mg | ORAL_TABLET | Freq: Every day | ORAL | Status: DC

## 2012-09-07 NOTE — Telephone Encounter (Signed)
Refill request for [facility]ram[facility]

## 2012-09-07 NOTE — Telephone Encounter (Signed)
Left vm that medication rx was faxed to [facility]

## 2012-09-07 NOTE — Telephone Encounter (Signed)
[facility]ed [facility]  Prescription has to be printed and signed and then faxed to them.  Given to Dr Wynn Banker to sign,

## 2012-09-24 ENCOUNTER — Telehealth: Payer: Self-pay

## 2012-09-24 MED ORDER — TRAMADOL HCL ER 300 MG PO TB24: 300.0000 mg | ORAL_TABLET | Freq: Every day | ORAL | Status: DC

## 2012-09-24 NOTE — Telephone Encounter (Signed)
Patient is [facility] [place] and patient's wife stated that [facility] was faxing something over stating they were out of the medication.

## 2012-09-24 NOTE — Telephone Encounter (Signed)
Left message advising patient [facility]

## 2012-11-15 ENCOUNTER — Other Ambulatory Visit: Payer: Self-pay

## 2013-02-28 ENCOUNTER — Other Ambulatory Visit: Payer: Self-pay | Admitting: Physical Medicine & Rehabilitation

## 2013-03-25 ENCOUNTER — Ambulatory Visit: Admitting: Physical Medicine & Rehabilitation

## 2013-03-25 ENCOUNTER — Encounter

## 2013-03-26 ENCOUNTER — Telehealth: Payer: Self-pay

## 2013-03-26 MED ORDER — TRAMADOL HCL ER 300 MG PO TB24: ORAL_TABLET | ORAL | Status: DC

## 2013-03-26 NOTE — Telephone Encounter (Signed)
Patients wife called requesting ultram refill.  Patient was unable to make appointment due to being out of town.  He did reschedule his appointment but will be out of his medication before then.  Please advise.

## 2013-03-26 NOTE — Telephone Encounter (Signed)
Tramadol ER 300 mg 1 tablet every day, #30 no refills needs appointment in the next mo

## 2013-04-29 ENCOUNTER — Encounter: Payer: Self-pay | Admitting: Physical Medicine & Rehabilitation

## 2013-04-29 ENCOUNTER — Ambulatory Visit (HOSPITAL_BASED_OUTPATIENT_CLINIC_OR_DEPARTMENT_OTHER): Admitting: Physical Medicine & Rehabilitation

## 2013-04-29 ENCOUNTER — Encounter: Attending: Physical Medicine & Rehabilitation

## 2013-04-29 VITALS — BP 150/88 | HR 102 | Resp 14

## 2013-04-29 DIAGNOSIS — G619 Inflammatory polyneuropathy, unspecified: Secondary | ICD-10-CM

## 2013-04-29 DIAGNOSIS — M47817 Spondylosis without myelopathy or radiculopathy, lumbosacral region: Secondary | ICD-10-CM

## 2013-04-29 DIAGNOSIS — M545 Low back pain, unspecified: Secondary | ICD-10-CM | POA: Insufficient documentation

## 2013-04-29 DIAGNOSIS — F411 Generalized anxiety disorder: Secondary | ICD-10-CM | POA: Insufficient documentation

## 2013-04-29 DIAGNOSIS — E109 Type 1 diabetes mellitus without complications: Secondary | ICD-10-CM | POA: Insufficient documentation

## 2013-04-29 DIAGNOSIS — G622 Polyneuropathy due to other toxic agents: Secondary | ICD-10-CM

## 2013-04-29 DIAGNOSIS — E039 Hypothyroidism, unspecified: Secondary | ICD-10-CM | POA: Insufficient documentation

## 2013-04-29 DIAGNOSIS — G8929 Other chronic pain: Secondary | ICD-10-CM | POA: Insufficient documentation

## 2013-04-29 MED ORDER — TRAMADOL HCL ER 300 MG PO TB24: ORAL_TABLET | ORAL | Status: DC

## 2013-04-29 NOTE — Patient Instructions (Signed)
Limit heat to 30 minutes per session

## 2013-04-29 NOTE — Progress Notes (Signed)
   Subjective:    Patient ID: Connor KinsmanChristopher D Holt, male    DOB: 10-05-1965, 48 y.o.   MRN: [id number]  HPI Weight fluctuating  Standing for 7 hours yest Taking oxycodone between 2-6 oxycodone 5mg  tablets per day Still taking tramadol New cholesterol med  Diagnosed with sleep apnea Diabetic control is good now that pump has alarm, but more hypoglycemic episodes Pain Inventory Average Pain 5 Pain Right Now 4 My pain is intermittent, burning, dull and aching  In the last 24 hours, has pain interfered with the following? General activity 4 Relation with others 3 Enjoyment of life 2 What TIME of day is your pain at its worst? varies Sleep (in general) Fair  Pain is worse with: bending, sitting, standing and driving Pain improves with: rest, heat/ice, therapy/exercise, pacing activities and medication Relief from Meds: 7  Mobility walk without assistance how many minutes can you walk? 20 ability to climb steps?  yes do you drive?  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  no  Physicians involved in your care Any changes since last visit?  no   Family History  Problem Relation Age of Onset  . Diabetes Father    History   Social History  . Marital Status: Married    Spouse Name: N/A    Number of Children: N/A  . Years of Education: N/A   Social History Main Topics  . Smoking status: Never Smoker   . Smokeless tobacco: Current User    Types: Snuff  . Alcohol Use: None  . Drug Use: No  . Sexual Activity: None   Other Topics Concern  . None   Social History Narrative  . None   History reviewed. No pertinent past surgical history. Past Medical History  Diagnosis Date  . Anxiety   . Diabetes mellitus   . Low back pain   . Dyslipidemia   . Hypothyroidism    BP 150/88   Pulse 102  Resp 14  SpO2 92%  Opioid Risk Score:   Fall Risk Score: Low Fall Risk (0-5 points) (educated and handout given)  Review of Systems  Musculoskeletal: Positive for back pain.  All other systems reviewed and are negative.      Objective:   Physical Exam  Skin mottled in low back, no skin lesions on the feet  Sensation normal at feet , except ant ankle on left LE strength is normal - SLR No tenderness to Palpation, normal spine range of motion      Assessment & Plan:  1. Lumbar spondylosis chronic low back pain. He receives tramadol 300 mg from this office and [place] [facility] [place]   Over half of the 25 min visit was spent counseling and coordinating care. Patient has been excessively using heat and has skin changes, recommend 30 minutes per session for heat at medium level I'll see him back in 6 months.

## 2013-07-18 ENCOUNTER — Telehealth: Payer: Self-pay

## 2013-07-18 NOTE — Telephone Encounter (Signed)
Patient is requesting a early fill on Tramadol because he is going out of town on Saturday. Patient states the pharmacy told him the refill was not due until 7/14.

## 2013-07-18 NOTE — Telephone Encounter (Signed)
[facility] inform [facility] that [facility] may get refill early this month. Patient is going on vacation and will run out of medication before he returns. Attempted to contact patient to inform him that his Tramadol refill would be ready for pickup at the pharmacy. Left a message on a verified voicemail.

## 2013-08-12 ENCOUNTER — Telehealth: Payer: Self-pay | Admitting: *Deleted

## 2013-08-12 ENCOUNTER — Telehealth: Payer: Self-pay

## 2013-08-12 MED ORDER — TRAMADOL HCL ER 300 MG PO TB24: ORAL_TABLET | ORAL | Status: DC

## 2013-08-12 NOTE — Telephone Encounter (Signed)
[facility] [place] [facility] one month [facility].  Notified Clemencia CourseAnita [place] at [employer] #

## 2013-08-12 NOTE — Telephone Encounter (Signed)
I spoke with Mrs Juventino SlovakCoggin multiple times and [facility] as well in [place].  [place] cannot take verbal order (out of state)from anyone but MD/NP for Tramadol and they will only issue a 5 day supply.  [facility] only [facility] 3 day "emergency supply".  They will take an e scribe for 30 day supply, but [place] will not permit e scribe of CIV.  Jacalyn LefevreEunice Thomas NP spoke with pharmacist Cornelious BryantJulieann and it is [facility] that is restricting quantity.  I asked MRS Rodak to find another pharmacy and let us know and we would try to call in a 5 day supply.  She called back with [facility] who will dispense 5 day supply.  I expained to MRS Gramling it would have made more sense just to call us and let us ok [place] [facility] to dispense early refill than to go through all of this and in the future she needs to plan better. She agrees.Riley Lamunice will call [facility] day [facility].  I had printed rx for [place] and then [place]unice to sign today but they will not be used so he did not get a 30 day supply.

## 2013-08-12 NOTE — Telephone Encounter (Signed)
Patient's wife called on his behalf requesting patient's Tramadol refill be called in at [facility] in [place]. They are on vacation and the [facility] [place] will not transfer the RX that has refills. [facility] [place] #[phone].

## 2013-08-13 ENCOUNTER — Telehealth: Payer: Self-pay | Admitting: *Deleted

## 2013-08-13 NOTE — Telephone Encounter (Signed)
Connor Holt called back and said that she has [facility] supply ready.  I told her ok to go get it and [facility] and forgo the [facility] call.  Once again I reminded her in the future not to leave town with out adequate medication coverage.  This process has been very difficult and would not be repeated.

## 2013-08-13 NOTE — Telephone Encounter (Addendum)
Left message for Mrs Connor Holt that the unfortunate situation with them being out of state in [place] is that she is going to get Connor Holt the 3 day supply at [facility] where the NP can call a verbal in because [facility] is going to require a hard copy of the prescription to release the 5 day supply.  Connor Holt is going to call the verbal order to the [facility] [place] and that is all we can do.  Connor Holt the # for [facility] Connor [facility] is [phone] and the pharmacist you spoke with was NauruJulianne

## 2013-08-15 ENCOUNTER — Telehealth: Payer: Self-pay | Admitting: *Deleted

## 2013-08-15 MED ORDER — TRAMADOL HCL ER 300 MG PO TB24
ORAL_TABLET | ORAL | Status: DC
Start: 2013-08-15 — End: 2013-10-28

## 2013-08-15 NOTE — Telephone Encounter (Signed)
Once again Mrs Juventino SlovakCoggin has called because they were only able to get the #5 tablets [facility] [place] because they have not received the electronically written rx required to dispense the full #30.  [name] is saying they are going to see her niece in [place] after leaving [place] and she has called and the state law there does not prohibit them from taking VO over the phone for the ultram.  She is asking to get the remainder of the prescription called to them.  I spoke with the pharmacist in [facility] [place] [facility] and called in the remainder #25 of this months rx.

## 2013-10-28 ENCOUNTER — Ambulatory Visit (HOSPITAL_BASED_OUTPATIENT_CLINIC_OR_DEPARTMENT_OTHER): Admitting: Physical Medicine & Rehabilitation

## 2013-10-28 ENCOUNTER — Encounter: Payer: Self-pay | Admitting: Physical Medicine & Rehabilitation

## 2013-10-28 ENCOUNTER — Encounter: Attending: Physical Medicine & Rehabilitation

## 2013-10-28 VITALS — BP 142/88 | HR 118 | Resp 14 | Wt 250.0 lb

## 2013-10-28 DIAGNOSIS — M47816 Spondylosis without myelopathy or radiculopathy, lumbar region: Secondary | ICD-10-CM

## 2013-10-28 DIAGNOSIS — M47817 Spondylosis without myelopathy or radiculopathy, lumbosacral region: Secondary | ICD-10-CM | POA: Insufficient documentation

## 2013-10-28 MED ORDER — TRAMADOL HCL ER 300 MG PO TB24: ORAL_TABLET | ORAL | Status: DC

## 2013-10-28 NOTE — Progress Notes (Signed)
Subjective:    Patient ID: Connor KinsmanChristopher D Holt, male    DOB: 1965-04-14, [age] y.o.   MRN: [id number] CC Low back pain HPI Refiling disability for diabetic complication Patient has history of multiple traumatic injuries to the spine during military service, skydiving as well as cast and recovery. Family medical history negative for lumbar degeneration or lumbar surgeries.   Now on CPAP for severe sleep apnea Diabetic control has been more labile lately. He will get a continuous blood glucose monitor attached to his insulin pump soon. Pain Inventory Average Pain 5 Pain Right Now 3 My pain is na  In the last 24 hours, has pain interfered with the following? General activity 3 Relation with others 3 Enjoyment of life 3 What TIME of day is your pain at its worst? morning and evening Sleep (in general) Poor  Pain is worse with: bending, sitting, inactivity, standing and some activites Pain improves with: rest, heat/ice, therapy/exercise, pacing activities and medication Relief from Meds: 7  Mobility walk without assistance how many minutes can you walk? 20 ability to climb steps?  yes do you drive?  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  no  Physicians involved in your care Any changes since last visit?  no   Family History  Problem Relation Age of Onset  . Diabetes Father    History   Social History  . Marital Status: Married    Spouse Name: N/A    Number of Children: N/A  . Years of Education: N/A   Social History Main Topics  . Smoking status: Never Smoker   . Smokeless tobacco: Current User    Types: Snuff  . Alcohol Use: None  . Drug Use: No  . Sexual Activity: None   Other Topics Concern  . None   Social History Narrative  . None   History reviewed. No pertinent past surgical history. Past Medical History  Diagnosis Date    . Anxiety   . Diabetes mellitus   . Low back pain   . Dyslipidemia   . Hypothyroidism    BP 142/88  Pulse 118  Resp 14  Wt 250 lb (113.399 kg)  SpO2 94%  Opioid Risk Score:   Fall Risk Score: Low Fall Risk (0-5 points) (previously educated and given  handout) Review of Systems  All other systems reviewed and are negative.      Objective:   Physical Exam  Nursing note and vitals reviewed. Constitutional: He is oriented to person, place, and time.  Musculoskeletal:  - SLR  4/5 in Bilateral lower ext   Neurological: He is alert and oriented to person, place, and time.  Psychiatric: He has a normal mood and affect.   Mottling of skin in the low back and upper buttocks area No tenderness to palpation in the lumbar spine Normal lumbar range of motion.       Assessment & Plan:  1. Lumbar spondylosis with chronic low back pain. He has had some relief with lumbar injections in the past radiofrequency did not provide a long duration of effect. [facility] [place] [facility] [place] [facility] Continues to receive tramadol ER 300 mg per day from this office. Discussed skin changes secondary to excessive use of heating pad. Recommendations are not to exceed 30 minutes at a time.  Over half of the 25 min visit was spent counseling  and coordinating care. We discussed his lumbar disability, the influence of his job activities which are relatively sedentary, also the initiation of his chronic back pain which likely stems from his [employer] and Compan[employer] training activities.  Return to clinic 6 months

## 2013-10-28 NOTE — Patient Instructions (Signed)
Heating pad no more than at a time

## 2014-02-12 ENCOUNTER — Emergency Department (HOSPITAL_COMMUNITY)
Admission: EM | Admit: 2014-02-12 | Discharge: 2014-02-12 | Disposition: A | Discharge status: Home / Self Care | Attending: Emergency Medicine | Admitting: Emergency Medicine

## 2014-02-12 ENCOUNTER — Emergency Department (HOSPITAL_COMMUNITY)

## 2014-02-12 ENCOUNTER — Encounter (HOSPITAL_COMMUNITY): Payer: Self-pay

## 2014-02-12 DIAGNOSIS — Z794 Long term (current) use of insulin: Secondary | ICD-10-CM | POA: Diagnosis not present

## 2014-02-12 DIAGNOSIS — F419 Anxiety disorder, unspecified: Secondary | ICD-10-CM | POA: Insufficient documentation

## 2014-02-12 DIAGNOSIS — E039 Hypothyroidism, unspecified: Secondary | ICD-10-CM | POA: Diagnosis not present

## 2014-02-12 DIAGNOSIS — E538 Deficiency of other specified B group vitamins: Secondary | ICD-10-CM | POA: Insufficient documentation

## 2014-02-12 DIAGNOSIS — E559 Vitamin D deficiency, unspecified: Secondary | ICD-10-CM | POA: Diagnosis not present

## 2014-02-12 DIAGNOSIS — R0789 Other chest pain: Secondary | ICD-10-CM | POA: Diagnosis not present

## 2014-02-12 DIAGNOSIS — E1165 Type 2 diabetes mellitus with hyperglycemia: Secondary | ICD-10-CM | POA: Diagnosis not present

## 2014-02-12 DIAGNOSIS — R202 Paresthesia of skin: Secondary | ICD-10-CM | POA: Insufficient documentation

## 2014-02-12 DIAGNOSIS — Z79899 Other long term (current) drug therapy: Secondary | ICD-10-CM | POA: Diagnosis not present

## 2014-02-12 DIAGNOSIS — E785 Hyperlipidemia, unspecified: Secondary | ICD-10-CM | POA: Insufficient documentation

## 2014-02-12 DIAGNOSIS — R079 Chest pain, unspecified: Secondary | ICD-10-CM | POA: Diagnosis present

## 2014-02-12 DIAGNOSIS — Z8639 Personal history of other endocrine, nutritional and metabolic disease: Secondary | ICD-10-CM

## 2014-02-12 LAB — BASIC METABOLIC PANEL
ANION GAP: 9 (ref 5–15)
BUN: 10 mg/dL (ref 6–23)
CHLORIDE: 102 mmol/L (ref 96–112)
CO2: 25 mmol/L (ref 19–32)
Calcium: 9.2 mg/dL (ref 8.4–10.5)
Creatinine, Ser: 0.81 mg/dL (ref 0.50–1.35)
GFR calc non Af Amer: >90 mL/min (ref >90)
GLUCOSE: 150 mg/dL — AB (ref 70–99)
POTASSIUM: 4 mmol/L (ref 3.5–5.1)
Sodium: 136 mmol/L (ref 135–145)

## 2014-02-12 LAB — CBC
HCT: 39.2 % (ref 39.0–52.0)
HEMOGLOBIN: 13 g/dL (ref 13.0–17.0)
MCH: 28 pg (ref 26.0–34.0)
MCHC: 33.2 g/dL (ref 30.0–36.0)
MCV: 84.3 fL (ref 78.0–100.0)
PLATELETS: 311 10*3/uL (ref 150–400)
RBC: 4.65 MIL/uL (ref 4.22–5.81)
RDW: 13 % (ref 11.5–15.5)
WBC: 5.6 10*3/uL (ref 4.0–10.5)

## 2014-02-12 LAB — I-STAT TROPONIN, ED: Troponin i, poc: 0 ng/mL (ref 0.00–0.08)

## 2014-02-12 LAB — TSH: TSH: 0.082 u[IU]/mL — AB (ref 0.350–4.500)

## 2014-02-12 MED ORDER — LORAZEPAM 1 MG PO TABS: 1.0000 mg | ORAL_TABLET | Freq: Four times a day (QID) | ORAL | Status: DC | PRN

## 2014-02-12 MED ORDER — SODIUM CHLORIDE 0.9 % IV BOLUS (SEPSIS)
1000.0000 mL | Freq: Once | INTRAVENOUS | Status: AC
  Administered 2014-02-12: 1000 mL via INTRAVENOUS

## 2014-02-12 MED ORDER — LORAZEPAM 2 MG/ML IJ SOLN
1.0000 mg | Freq: Once | INTRAMUSCULAR | Status: AC
  Administered 2014-02-12: 1 mg via INTRAVENOUS
  Filled 2014-02-12: qty 1

## 2014-02-12 NOTE — ED Notes (Signed)
Pt c/o increasing generalized tingling starting last night and chest tightness x 1 episode this morning.  Denies pain.  Sts tingling is worse around mouth.  Hx of DM and anxiety.  Pt does not take medication for anxiety.

## 2014-02-12 NOTE — ED Provider Notes (Signed)
CSN: [id number]     Arrival date & time 02/12/14  1456 History   First MD Initiated Contact with Patient 02/12/14 [phone]     Chief Complaint  Patient presents with  . Tingling  . Chest Pain     (Consider location/radiation/quality/duration/timing/severity/associated sxs/prior Treatment) HPI Comments: Connor Holt is a 49 y.o. male with a PMHx of DM2, HLD, anxiety, hypothyroidism, and chronic low back pain, who presents to the ED with complaints of circumoral and bilateral extremity tingling which he describes as a "strange sensation under my skin" with no specific distribution. He states it began last night and has been constant since. He denies any pain. He states that initially this morning he had some chest tightness which he states was due to a feeling of anxiety, and resolved entirely prior to arrival. He denies any ongoing chest pain or any chest pain in the last 6 hours. He states that he had an increase in his thyroid medication approximately 36 months ago and his lab work had come back abnormal but he is not sure if it was up or down. He does have a history of vertigo and he is currently undergoing diagnostic evaluation had an ear nose and throat doctor, and there has been some mention of needing a head CT but he has not had this yet. He denies any new medications, numbness, tongue or lip swelling, fevers, chills, chest pain, shortness of breath, abdominal pain, nausea, vomiting, diarrhea, constipation, dysuria, hematuria, unintentional weight change, penile discharge, weakness, vision changes, headache, syncope, lightheadedness, dizziness, vertigo, tinnitus, or rashes. Denies any alcohol use. Sexually active with one male partner, no new partners in the last year. He reports that he occasionally misses his B-12 injections, but is compliant with his vitamin D medication.  Patient is a 49 y.o. male presenting with general illness. The history is provided by the patient. No language  interpreter was used.  Illness Location:  Circumoral and b/l extremities "tingling" Quality:  "tingling" Severity:  Mild Onset quality:  Gradual Duration:  1 day Timing:  Constant Progression:  Unchanged Chronicity:  New Context:  At rest Relieved by:  Nothing tried Worsened by:  Nothing tried Ineffective treatments:  Nothing tried Associated symptoms: no abdominal pain, no chest pain, no diarrhea, no ear pain, no fever, no headaches, no loss of consciousness, no myalgias, no nausea, no rash, no shortness of breath and no vomiting   Risk factors:  Vitamin D and vitamin B12 deficiencies with some noncompliance, hypothyroidism   Past Medical History  Diagnosis Date  . Anxiety   . Diabetes mellitus   . Low back pain   . Dyslipidemia   . Hypothyroidism    Past Surgical History  Procedure Laterality Date  . Hernia repair    . Cardiac catheterization     Family History  Problem Relation Age of Onset  . Diabetes Father    History  Substance Use Topics  . Smoking status: Never Smoker   . Smokeless tobacco: Current User    Types: Snuff  . Alcohol Use: No    Review of Systems  Constitutional: Negative for fever, chills and diaphoresis.  HENT: Negative for ear pain, hearing loss, sinus pressure and tinnitus.   Eyes: Negative for photophobia, pain and visual disturbance.  Respiratory: Positive for chest tightness (this morning which has since completely resolved). Negative for shortness of breath.   Cardiovascular: Negative for chest pain, palpitations and leg swelling.  Gastrointestinal: Negative for nausea, vomiting, abdominal pain, diarrhea, constipation  and blood in stool.  Genitourinary: Negative for dysuria, hematuria and discharge.  Musculoskeletal: Negative for myalgias, back pain, arthralgias, neck pain and neck stiffness.  Skin: Negative for rash.  Allergic/Immunologic: Negative for immunocompromised state.  Neurological: Negative for dizziness, loss of  consciousness, syncope, weakness, light-headedness, numbness and headaches.       +"tingling" in b/l extremities and circumorally  Psychiatric/Behavioral: Negative for confusion.   10 Systems reviewed and are negative for acute change except as noted in the HPI.    Allergies  Review of patient's allergies indicates no known allergies.  Home Medications   Prior to Admission medications   Medication Sig Start Date End Date Taking? Authorizing Provider  cholecalciferol (VITAMIN D) 1000 UNITS tablet Take 1,000 Units by mouth daily.    Historical Provider, MD  gabapentin (NEURONTIN) 400 MG capsule Take 400 mg by mouth 3 (three) times daily.      Historical Provider, MD  insulin lispro (HUMALOG) 100 UNIT/ML injection Dosed through insulin pump, varies with intake/ continuous dosing    Historical Provider, MD  levothyroxine (SYNTHROID) 125 MCG tablet Take 125 mcg by mouth daily.      Historical Provider, MD  lisinopril (PRINIVIL,ZESTRIL) 5 MG tablet Take 5 mg by mouth daily.      Historical Provider, MD  ONE TOUCH ULTRA TEST test strip TEST BLOOD SUGAR 8 OR 9 TIMES DAILY AS DIRECTED 06/06/12   Romero Belling, MD  simvastatin (ZOCOR) 40 MG tablet Take 40 mg by mouth every evening.    Historical Provider, MD  traMADol (ULTRAM-ER) 300 MG 24 hr tablet TAKE 1 TABLET BY MOUTH EVERY DAY 10/28/13   Erick Colace, MD  vitamin B-12 (CYANOCOBALAMIN) 1000 MCG tablet Take 1,000 mcg by mouth daily. 1cc IM every month     Historical Provider, MD   BP 160/95 mmHg  Pulse 77  Temp(Src) 98.2 F (36.8 C) (Oral)  Resp 12  SpO2 99% Physical Exam  Constitutional: He is oriented to person, place, and time. Vital signs are normal. He appears well-developed and well-nourished.  Non-toxic appearance. No distress.  Afebrile, nontoxic, NAD, mildly elevated BP  HENT:  Head: Normocephalic and atraumatic.  Mouth/Throat: Oropharynx is clear and moist and mucous membranes are normal.  No facial asymmetry  Eyes:  Conjunctivae and EOM are normal. Pupils are equal, round, and reactive to light. Right eye exhibits no discharge. Left eye exhibits no discharge.  PERRL, EOMI, no nystagmus  Neck: Normal range of motion. Neck supple.  FROM intact without spinous process or paraspinous muscle TTP, no bony stepoffs or deformities, no muscle spasms. No rigidity or meningeal signs.   Cardiovascular: Normal rate, regular rhythm, normal heart sounds and intact distal pulses.  Exam reveals no gallop and no friction rub.   No murmur heard. RRR, nl s1/s2, no m/r/g, distal pulses intact, no pedal edema   Pulmonary/Chest: Effort normal and breath sounds normal. No respiratory distress. He has no decreased breath sounds. He has no wheezes. He has no rhonchi. He has no rales.  Abdominal: Soft. Normal appearance and bowel sounds are normal. He exhibits no distension. There is no tenderness. There is no rigidity, no rebound, no guarding, no tenderness at McBurney's point and negative Murphy's sign.  Musculoskeletal: Normal range of motion.  MAE x4 Strength 5/5 in all extremities Sensation grossly intact in all extremities Gait steady  Neurological: He is alert and oriented to person, place, and time. He has normal strength and normal reflexes. No sensory deficit. He displays a  negative Romberg sign. Coordination and gait normal.  CN 2-12 grossly intact A&O x4 GCS 15 DTRs symmetric bilaterally Sensation and strength intact Gait nonataxic including with tandem walking Coordination with finger-to-nose WNL Neg romberg, neg pronator drift   Skin: Skin is warm, dry and intact. No rash noted.  No rashes  Psychiatric: He has a normal mood and affect.  Nursing note and vitals reviewed.   ED Course  Procedures (including critical care time) Labs Review Labs Reviewed  BASIC METABOLIC PANEL - Abnormal; Notable for the following:    Glucose, Bld 150 (*)    All other components within normal limits  CBC  TSH  T4, FREE   I-STAT TROPOININ, ED    Imaging Review Dg Chest 2 View  02/12/2014   CLINICAL DATA:  Facial and tongue tingling since last night. Chest tightness. History of hypertension and diabetes. Initial encounter.  EXAM: CHEST  2 VIEW  COMPARISON:  10/28/2008 and 02/17/2012.  FINDINGS: The heart size and mediastinal contours are normal. The lungs are clear. There is no pleural effusion or pneumothorax. No acute osseous findings are identified. Telemetry leads overlie the chest.  IMPRESSION: Stable examination.  No active cardiopulmonary process.   Electronically Signed   By: Roxy HorsemanBill  Veazey M.D.   On: 02/12/2014 16:12     EKG Interpretation   Date/Time:  Wednesday February 12 2014 15:04:03 EST Ventricular Rate:  84 PR Interval:  152 QRS Duration: 89 QT Interval:  352 QTC Calculation: 416 R Axis:   51 Text Interpretation:  Sinus rhythm Ventricular premature complex No  significant change since last tracing Confirmed by YAO  MD, DAVID ([id number])  on 02/12/2014 3:48:47 PM      MDM   Final diagnoses:  Chest tightness  Paresthesias  History of hypothyroidism  History of non anemic vitamin B12 deficiency    49 y.o. male with tingling around his mouth and in bilateral extremities, no specific distribution. Had chest tightness this morning, but states it was from anxiety and he has not had any ongoing chest discomfort since this morning. No focal neuro deficits. Pt states he used to take anxiety meds but no longer takes it. Also admits that he's not completely compliant with B12 injections, which could be cause of his symptoms. Will get basic labs, EKG, CXR, and thyroid studies. Will give ativan to see if this helps with his symptoms. Given no ongoing CP, will hold on MONA therapies. Will reassess shortly.   5:53 PM CBC WNL, BMP with hyperglycemia at 150 but otherwise WNL, no anion gap. Trop neg. CXR WNL, EKG with NSR and one PVC but unchanged from prior. Pt feeling somewhat improved with ativan.  Discussed that this could be some component of anxiety, vs vitamin deficiency or thyroid abnormality. Will follow up on TSH/T4 as these have not resulted. Pt will f/up with his PCP for ongoing management. Will send home with ativan tabs. I explained the diagnosis and have given explicit precautions to return to the ER including for any other new or worsening symptoms. The patient understands and accepts the medical plan as it's been dictated and I have answered their questions. Discharge instructions concerning home care and prescriptions have been given. The patient is STABLE and is discharged to home in good condition.  BP 117/75 mmHg  Pulse 77  Temp(Src) 98 F (36.7 C) (Oral)  Resp 16  SpO2 99%  Meds ordered this encounter  Medications  . LORazepam (ATIVAN) injection 1 mg    Sig:   .  sodium chloride 0.9 % bolus 1,000 mL    Sig:   . LORazepam (ATIVAN) 1 MG tablet    Sig: Take 1 tablet (1 mg total) by mouth every 6 (six) hours as needed for anxiety.    Dispense:  10 tablet    Refill:  0    Order Specific Question:  Supervising Provider    Answer:  Vida Roller [address] [place], PA-C 02/12/14 1758  Richardean Canal, MD 02/12/14 2249

## 2014-02-12 NOTE — Discharge Instructions (Signed)
Your symptoms could be due to anxiety, use ativan as directed as needed. Stay well hydrated, and follow up with your regular doctor for ongoing management since this could be related to a vitamin deficiency. Return to the ER for changes or worsening symptoms.   Paresthesia Paresthesia is a burning or prickling feeling. This feeling can happen in any part of the body. It often happens in the hands, arms, legs, or feet. HOME CARE  Avoid drinking alcohol.  Try massage or needle therapy (acupuncture) to help with your problems.  Keep all doctor visits as told. GET HELP RIGHT AWAY IF:   You feel weak.  You have trouble walking or moving.  You have problems speaking or seeing.  You feel confused.  You cannot control when you poop (bowel movement) or pee (urinate).  You lose feeling (numbness) after an injury.  You pass out (faint).  Your burning or prickling feeling gets worse when you walk.  You have pain, cramps, or feel dizzy.  You have a rash. MAKE SURE YOU:   Understand these instructions.  Will watch your condition.  Will get help right away if you are not doing well or get worse. Document Released: 12/10/2007 Document Revised: 03/21/2011 Document Reviewed: 09/17/2010 [facility] Patient Information 2015 [place], [place]. This information is not intended to replace advice given to you by your health care provider. Make sure you discuss any questions you have with your health care provider.  Chest Pain (Nonspecific) It is often hard to give a diagnosis for the cause of chest pain. There is always a chance that your pain could be related to something serious, such as a heart attack or a blood clot in the lungs. You need to follow up with your doctor. HOME CARE  If antibiotic medicine was given, take it as directed by your doctor. Finish the medicine even if you start to feel better.  For the next few days, avoid activities that bring on chest pain. Continue physical  activities as told by your doctor.  Do not use any tobacco products. This includes cigarettes, chewing tobacco, and e-cigarettes.  Avoid drinking alcohol.  Only take medicine as told by your doctor.  Follow your doctor's suggestions for more testing if your chest pain does not go away.  Keep all doctor visits you made. GET HELP IF:  Your chest pain does not go away, even after treatment.  You have a rash with blisters on your chest.  You have a fever. GET HELP RIGHT AWAY IF:   You have more pain or pain that spreads to your arm, neck, jaw, back, or belly (abdomen).  You have shortness of breath.  You cough more than usual or cough up blood.  You have very bad back or belly pain.  You feel sick to your stomach (nauseous) or throw up (vomit).  You have very bad weakness.  You pass out (faint).  You have chills. This is an emergency. Do not wait to see if the problems will go away. Call your local emergency services (911 in [place] Do not drive yourself to the hospital. MAKE SURE YOU:   Understand these instructions.  Will watch your condition.  Will get help right away if you are not doing well or get worse. Document Released: 06/15/2007 Document Revised: 01/01/2013 Document Reviewed: 06/15/2007 [facility] Patient Information 2015 [place], [place]. This information is not intended to replace advice given to you by your health care provider. Make sure you discuss any questions you have with  your health care provider. ° °

## 2014-02-13 LAB — T4, FREE: Free T4: 1.66 ng/dL (ref 0.80–1.80)

## 2014-04-02 ENCOUNTER — Other Ambulatory Visit: Payer: Self-pay | Admitting: *Deleted

## 2014-04-02 MED ORDER — TRAMADOL HCL ER 300 MG PO TB24: ORAL_TABLET | ORAL | Status: DC

## 2014-04-02 NOTE — Telephone Encounter (Signed)
[name] called asking for a refill for ultram for Connor Holt, He has a 6 month f/u appt 04/28/14.

## 2014-04-28 ENCOUNTER — Encounter: Attending: Physical Medicine & Rehabilitation

## 2014-04-28 ENCOUNTER — Encounter: Payer: Self-pay | Admitting: Physical Medicine & Rehabilitation

## 2014-04-28 ENCOUNTER — Ambulatory Visit (HOSPITAL_BASED_OUTPATIENT_CLINIC_OR_DEPARTMENT_OTHER): Admitting: Physical Medicine & Rehabilitation

## 2014-04-28 VITALS — BP 106/77 | HR 86 | Resp 14

## 2014-04-28 DIAGNOSIS — M797 Fibromyalgia: Secondary | ICD-10-CM

## 2014-04-28 DIAGNOSIS — M47817 Spondylosis without myelopathy or radiculopathy, lumbosacral region: Secondary | ICD-10-CM

## 2014-04-28 DIAGNOSIS — M7918 Myalgia, other site: Secondary | ICD-10-CM

## 2014-04-28 MED ORDER — TRAMADOL HCL ER 300 MG PO TB24: ORAL_TABLET | ORAL | Status: DC

## 2014-04-28 NOTE — Patient Instructions (Signed)
I believe the pain is a combination of muscle pain in the hip rotator muscles on the right side As well as right lumbar arthritis

## 2014-04-28 NOTE — Progress Notes (Signed)
Subjective:    Patient ID: Connor Holt, male    DOB: Aug 14, 1965, 49 y.o.   MRN: [id number]  HPI The patient returns today with increasing low back pain as well as right buttocks pain. He does not have any numbness going down his leg. He does feel tight in both hamstrings. He's had no falls or any injuries. He's not had any changes to his medications. His activity level has not changed. He continues to walk as well as perform the PT exercises that he received some years ago. He has lost about 25 pounds.   Pain Inventory Average Pain 6 Pain Right Now 7 My pain is constant  In the last 24 hours, has pain interfered with the following? General activity 7 Relation with others 7 Enjoyment of life 7 What TIME of day is your pain at its worst? morning, evening Sleep (in general) Poor  Pain is worse with: bending, inactivity, standing and some activites Pain improves with: rest, heat/ice, therapy/exercise, pacing activities and medication Relief from Meds: 6  Mobility walk without assistance how many minutes can you walk? 15-20 ability to climb steps?  yes do you drive?  yes Do you have any goals in this area?  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  no  Physicians involved in your care Any changes since last visit?  no   Family History  Problem Relation Age of Onset  . Diabetes Father    History   Social History  . Marital Status: Married    Spouse Name: N/A  . Number of Children: N/A  . Years of Education: N/A   Social History Main Topics  . Smoking status: Never Smoker   . Smokeless tobacco: Current User    Types: Snuff  . Alcohol Use: No  . Drug Use: No  . Sexual Activity: Not on file   Other Topics Concern  . None   Social History Narrative   Past Surgical History    Procedure Laterality Date  . Hernia repair    . Cardiac catheterization     Past Medical History  Diagnosis Date  . Anxiety   . Diabetes mellitus   . Low back pain   . Dyslipidemia   . Hypothyroidism    BP 106/77 mmHg  Pulse 86  Resp 14  SpO2 99%  Opioid Risk Score:   Fall Risk Score:  `1  Depression screen PHQ 2/9  No flowsheet data found.  2 Review of Systems  All other systems reviewed and are negative.      Objective:   Physical Exam  Constitutional: He is oriented to person, place, and time. He appears well-developed and well-nourished.  HENT:  Head: Normocephalic and atraumatic.  Eyes: Conjunctivae and EOM are normal. Pupils are equal, round, and reactive to light.  Musculoskeletal:       Lumbar back: He exhibits tenderness.       Back:  Neurological: He is alert and oriented to person, place, and time. He has normal strength. Gait abnormal.  Psychiatric: He has a normal mood and affect.  Nursing note and vitals reviewed.   Negative straight leg raising test, there is pain in the right buttock's with hip adduction  Negative Faber's maneuver No tenderness over the greater trochanter of the right hip  Assessment & Plan:  1. Right-sided low back pain multifactorial. The more midline pain is likely his severe spondylosis in the L5-S1 region. The buttocks pain is most likely muscular.  Recommend repeat radiofrequency procedure on the right side Continue stretching. May need trigger point injection versus PT for his right buttocks pain at this point does not appear to be sacroiliac

## 2014-05-23 ENCOUNTER — Telehealth: Payer: Self-pay | Admitting: *Deleted

## 2014-05-23 NOTE — Telephone Encounter (Signed)
[name] called to ask if a valium can be called in for Connor DeerChristopher for his procedure on Tuesday.

## 2014-05-26 ENCOUNTER — Telehealth: Payer: Self-pay | Admitting: *Deleted

## 2014-05-26 MED ORDER — DIAZEPAM 10 MG PO TABS: 10.0000 mg | ORAL_TABLET | Freq: Once | ORAL | Status: DC

## 2014-05-26 NOTE — Telephone Encounter (Signed)
Pt called about npo instructions prior to his procedure tomorrow. I informed him that he is to stop solid food intake 4 hours prior, may continue with clear liquids

## 2014-05-26 NOTE — Telephone Encounter (Signed)
I notified Mrs Juventino SlovakCoggin this will be called in. [facility] [address] [facility] Mrs Stoy request.

## 2014-05-26 NOTE — Telephone Encounter (Signed)
May call in valium 10mg  1 tab to be taken prior to procedure

## 2014-05-27 ENCOUNTER — Ambulatory Visit (HOSPITAL_BASED_OUTPATIENT_CLINIC_OR_DEPARTMENT_OTHER): Admitting: Physical Medicine & Rehabilitation

## 2014-05-27 ENCOUNTER — Encounter: Attending: Physical Medicine & Rehabilitation

## 2014-05-27 ENCOUNTER — Encounter: Payer: Self-pay | Admitting: Physical Medicine & Rehabilitation

## 2014-05-27 VITALS — BP 127/68 | HR 73 | Resp 14

## 2014-05-27 DIAGNOSIS — M797 Fibromyalgia: Secondary | ICD-10-CM | POA: Diagnosis not present

## 2014-05-27 DIAGNOSIS — M47817 Spondylosis without myelopathy or radiculopathy, lumbosacral region: Secondary | ICD-10-CM | POA: Diagnosis present

## 2014-05-27 NOTE — Progress Notes (Signed)
  PROCEDURE RECORD [facility]   Name: Amedeo KinsmanChristopher D Ritsema DOB:[birth year] MRN: [id number]  Date:05/27/2014  Physician: Claudette LawsAndrew Kirsteins, MD    Nurse/CMA: MaryBeth Ginkel  Allergies: No Known Allergies  Consent Signed: Yes.    Is patient diabetic? Yes.    CBG today? 154 Pregnant: No. LMP: No LMP for male patient. (age 47-55)  Anticoagulants: no Anti-inflammatory: no Antibiotics: did not take doxycycline today, did take 1 yesterday  Procedure: Right side radiofrequency neurotomy  Position: Prone Start Time:3:13pm  End Time:   Fluoro Time:   RN/CMA Ken Ariele Vidrio MaryBeth Ginkel    Time 2:15 pm 3:37    BP 127/68 118/80    Pulse 73 78    Respirations 14 14    O2 Sat 97 100    S/S 6 6    Pain Level 5/10 3/10     D/C home with WIFE, patient A & O X 3, D/C instructions reviewed, and sits independently.

## 2014-05-27 NOTE — Patient Instructions (Addendum)
You had a radio frequency procedure today This was done to alleviate joint pain in your lumbar area We injected a combination of dexamethasone which is a steroid as well as lidocaine which is a local anesthetic. Dexamethasone may increased blood sugars you are diabetic You may experience soreness at the injection sites. You may also experienced some irritation of the nerves that were heated I'm recommending ice for 30 minutes every 2 hours as needed for the next 24-48 hours In addition he will be taking gabapentin 400 mg 4 more times  today only

## 2014-05-27 NOTE — Progress Notes (Signed)
RightL5 dorsal ramus., Right L4 and Right L3 medial branch radio frequency neuropathy under fluoroscopic guidance   Indication: Low back pain due to lumbar spondylosis which has been relieved on 2 occasions by greater than 50% by lumbar medial branch blocks at corresponding levels.  Informed consent was obtained after describing risks and benefits of the procedure with the patient, this includes bleeding, bruising, infection, paralysis and medication side effects. The patient wishes to proceed and has given written consent. The patient was placed in a prone position. The lumbar and sacral area was marked and prepped with Betadine. A 25-gauge 1-1/2 inch needle was inserted into the skin and subcutaneous tissue at 3 sites in one ML of 1% lidocaine was injected into each site. Then a 20-gauge 10cm cm radio frequency needle with a 1 cm curved active tip was inserted targeting the Right S1 SAP/sacral ala junction. Bone contact was made and confirmed with lateral imaging. Sensory stimulation at 50 Hz followed by motor stimulation at 2 Hz confirm proper needle location followed by injection of one ML of the solution containing one ML of 4 mg per mL dexamethasone and 3 mL of 1% MPF lidocaine. Then the Right L5 SAP/transverse process junction was targeted. Bone contact was made and confirmed with lateral imaging. Sensory stimulation at 50 Hz followed by motor stimulation at 2 Hz confirm proper needle location followed by injection of one ML of the solution containing one ML of 4 mg per mL dexamethasone and 3 mL of 1% MPF lidocaine. Then the Right L4 SAP/transverse process junction was targeted. Bone contact was made and confirmed with lateral imaging. Sensory stimulation at 50 Hz followed by motor stimulation at 2 Hz confirm proper needle location followed by injection of one ML of the solution containing one ML of 4 mg per mL dexamethasone and 3 mL of 1% MPF lidocaine. Radio frequency lesion being at 80C for 90 seconds  was performed. Needles were removed. Post procedure instructions and vital signs were performed. Patient tolerated procedure well. Followup appointment was given. 

## 2014-06-30 ENCOUNTER — Encounter: Payer: Self-pay | Admitting: Physical Medicine & Rehabilitation

## 2014-06-30 ENCOUNTER — Encounter: Attending: Physical Medicine & Rehabilitation

## 2014-06-30 ENCOUNTER — Ambulatory Visit (HOSPITAL_BASED_OUTPATIENT_CLINIC_OR_DEPARTMENT_OTHER): Admitting: Physical Medicine & Rehabilitation

## 2014-06-30 VITALS — BP 115/78 | HR 86 | Resp 14

## 2014-06-30 DIAGNOSIS — M797 Fibromyalgia: Secondary | ICD-10-CM | POA: Diagnosis not present

## 2014-06-30 DIAGNOSIS — M47817 Spondylosis without myelopathy or radiculopathy, lumbosacral region: Secondary | ICD-10-CM

## 2014-06-30 NOTE — Progress Notes (Signed)
Subjective:    Patient ID: Connor Holt, male    DOB: 09-Apr-1965, 49 y.o.   MRN: [id number]  HPI 05/27/14-RightL5 dorsal ramus., Right L4 and Right L3 medial branch radio frequency neurotomy under fluoroscopic guidance  Blood sugars were up to 400-500  Patient has had good relief on the right side of his low back. Left side is a little bit more tender but not to the point where he wishes to schedule left-sided radiofrequency at the current time  Pain Inventory Average Pain 4 Pain Right Now 3 My pain is constant, burning, dull and aching  In the last 24 hours, has pain interfered with the following? General activity 4 Relation with others 3 Enjoyment of life 3 What TIME of day is your pain at its worst? morning and evening  Sleep (in general) Poor  Pain is worse with: bending, sitting, inactivity, standing and some activites Pain improves with: rest, heat/ice, therapy/exercise, pacing activities and medication Relief from Meds: 3  Mobility walk without assistance how many minutes can you walk? 20 ability to climb steps?  yes do you drive?  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  no  Physicians involved in your care Any changes since last visit?  no   Family History  Problem Relation Age of Onset  . Diabetes Father    History   Social History  . Marital Status: Married    Spouse Name: N/A  . Number of Children: N/A  . Years of Education: N/A   Social History Main Topics  . Smoking status: Never Smoker   . Smokeless tobacco: Current User    Types: Snuff  . Alcohol Use: No  . Drug Use: No  . Sexual Activity: Not on file   Other Topics Concern  . None   Social History Narrative   Past Surgical History  Procedure Laterality Date  . Hernia repair    . Cardiac catheterization     Past Medical History  Diagnosis  Date  . Anxiety   . Diabetes mellitus   . Low back pain   . Dyslipidemia   . Hypothyroidism    BP 115/78 mmHg  Pulse 86  Resp 14  SpO2 98%  Opioid Risk Score:   Fall Risk Score: Low Fall Risk (0-5 points)`1  Depression screen PHQ 2/9  No flowsheet data found.   Review of Systems  Constitutional: Negative.   HENT: Negative.   Eyes: Negative.   Respiratory: Negative.   Cardiovascular: Negative.   Gastrointestinal: Negative.   Endocrine: Negative.   Genitourinary: Negative.   Musculoskeletal: Positive for myalgias, back pain and arthralgias.  Skin: Negative.   Allergic/Immunologic: Negative.   Neurological: Negative.   Hematological: Negative.   Psychiatric/Behavioral: Negative.        Objective:   Physical Exam  Constitutional: He appears well-developed and well-nourished.  HENT:  Head: Normocephalic and atraumatic.  Eyes: Conjunctivae are normal. Pupils are equal, round, and reactive to light.  Neck: Normal range of motion.  Skin: He is not diaphoretic.  Psychiatric: He has a normal mood and affect.  Nursing note and vitals reviewed.  Lumbar spine has no tenderness palpation There is limited flexion extension lateral bending and rotation. Pain is mainly with right-sided lateral bending as well as extension. Negative straight leg raising Gait without evidence of toe drag or knee instability  Assessment & Plan:   1. Lumbar spondylosis there is evidence on imaging studies and has good relief with medial branch blocks. Has had good response to the right radiofrequency L3-L4 5 We discussed the average duration of response we also discussed at length issues pertaining to repeat ability. Patient was asking whether these procedures can be done indefinitely. We did discuss that the response rate on repeat procedures was 85% meaning that there is some dropout of responders. The exact reason for this is not known but potentially could involve scarring around the  nerve preventing Accurate localization.  He continues to receive his oxycodone through the [place], he does get tramadol ER prescribed through this office.  Return to clinic 3 months

## 2014-06-30 NOTE — Patient Instructions (Signed)
Call if you want to get another injection

## 2014-09-30 ENCOUNTER — Encounter: Attending: Physical Medicine & Rehabilitation

## 2014-09-30 ENCOUNTER — Ambulatory Visit (HOSPITAL_BASED_OUTPATIENT_CLINIC_OR_DEPARTMENT_OTHER): Admitting: Physical Medicine & Rehabilitation

## 2014-09-30 ENCOUNTER — Encounter: Payer: Self-pay | Admitting: Physical Medicine & Rehabilitation

## 2014-09-30 VITALS — BP 125/77 | HR 77 | Resp 14

## 2014-09-30 DIAGNOSIS — M797 Fibromyalgia: Secondary | ICD-10-CM | POA: Diagnosis not present

## 2014-09-30 DIAGNOSIS — M47817 Spondylosis without myelopathy or radiculopathy, lumbosacral region: Secondary | ICD-10-CM | POA: Insufficient documentation

## 2014-09-30 MED ORDER — TRAMADOL HCL ER 300 MG PO TB24: ORAL_TABLET | ORAL | Status: DC

## 2014-09-30 NOTE — Progress Notes (Signed)
Subjective:    Patient ID: Connor Holt, male    DOB: September 24, 1965, 49 y.o.   MRN: [id number] 05/27/14-RightL5 dorsal ramus., Right L4 and Right L3 medial branch radio frequency neurotomy under fluoroscopic guidance  HPI Left side lumbar pain is manageable Right-sided lumbar pain flared up a little bit this week patient thinks it was the weather. No new injuries. No pain radiating into the legs.   Pain Inventory Average Pain 4 Pain Right Now 3 My pain is intermittent, burning, dull, tingling and aching  In the last 24 hours, has pain interfered with the following? General activity 3 Relation with others 3 Enjoyment of life 3 What TIME of day is your pain at its worst? morning, night Sleep (in general) Poor  Pain is worse with: bending, sitting, inactivity, standing and some activites Pain improves with: rest, heat/ice, therapy/exercise, pacing activities and medication Relief from Meds: 7  Mobility walk without assistance how many minutes can you walk? 20 ability to climb steps?  yes do you drive?  yes Do you have any goals in this area?  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  no  Neuro/Psych No problems in this area  Prior Studies Any changes since last visit?  no  Physicians involved in your care Any changes since last visit?  no   Family History  Problem Relation Age of Onset  . Diabetes Father    Social History   Social History  . Marital Status: Married    Spouse Name: N/A  . Number of Children: N/A  . Years of Education: N/A   Social History Main Topics  . Smoking status: Never Smoker   . Smokeless tobacco: Current User    Types: Snuff  . Alcohol Use: No  . Drug Use: No  . Sexual Activity: Not Asked   Other Topics Concern  . None   Social History Narrative   Past Surgical History  Procedure Laterality Date    . Hernia repair    . Cardiac catheterization     Past Medical History  Diagnosis Date  . Anxiety   . Diabetes mellitus   . Low back pain   . Dyslipidemia   . Hypothyroidism    BP 125/77 mmHg  Pulse 77  Resp 14  SpO2 97%  Opioid Risk Score:   Fall Risk Score:  `1  Depression screen PHQ 2/9  Depression screen PHQ 2/9 09/30/2014  Decreased Interest 0  Down, Depressed, Hopeless 0  PHQ - 2 Score 0     Review of Systems     Objective:   Physical Exam  Constitutional: He is oriented to person, place, and time. He appears well-developed and well-nourished.  HENT:  Head: Normocephalic and atraumatic.  Eyes: Conjunctivae and EOM are normal. Pupils are equal, round, and reactive to light.  Musculoskeletal:       Lumbar back: He exhibits decreased range of motion. He exhibits no tenderness, no swelling and no pain.  Neurological: He is alert and oriented to person, place, and time.  Psychiatric: He has a normal mood and affect.  Nursing note and vitals reviewed.  Negative straight leg raising test Gait is without evidence toe drag or knee instability       Assessment & Plan:  1. Lumbar spondylosis there is evidence on imaging studies and has good relief with medial branch  blocks. Has had good response to the right radiofrequency L3-L4 5 The patient doesn't feel his left-sided low back pain is severe enough to undergo radiofrequency procedure. We did discuss that his right side can be repeated as early as November. Previously his other radiofrequencies have lasted for greater than 6 months. Will see the patient back in 6 months for follow-up. Continue tramadol 300 mg extended release per day prescription written six-month supply.

## 2014-09-30 NOTE — Patient Instructions (Addendum)
Dr Orlan Leavens or Dr Amanda Pea at [facility]

## 2015-03-02 ENCOUNTER — Telehealth: Payer: Self-pay | Admitting: *Deleted

## 2015-03-02 ENCOUNTER — Other Ambulatory Visit: Payer: Self-pay | Admitting: Physical Medicine & Rehabilitation

## 2015-03-02 NOTE — Telephone Encounter (Signed)
Pt's wife called saying they were unable to pick up his rx for tramadol. Saying it was denied or something.   I called the pharmacy and confirmed that the script was phoned in, the medication is out of stock and would have to be ordered.  I called pt's wife back and informed that the Rx was not denied it is simply out of stock. I advised her to call the pharmacy to give them the go ahead to reorder or to call us back to see if we can try an alternative pharmacy

## 2015-03-30 ENCOUNTER — Encounter

## 2015-03-30 ENCOUNTER — Ambulatory Visit: Admitting: Physical Medicine & Rehabilitation

## 2015-03-31 ENCOUNTER — Encounter: Payer: Self-pay | Admitting: Physical Medicine & Rehabilitation

## 2015-03-31 ENCOUNTER — Ambulatory Visit (HOSPITAL_BASED_OUTPATIENT_CLINIC_OR_DEPARTMENT_OTHER): Admitting: Physical Medicine & Rehabilitation

## 2015-03-31 ENCOUNTER — Encounter: Attending: Physical Medicine & Rehabilitation

## 2015-03-31 VITALS — BP 136/80 | HR 68 | Resp 14

## 2015-03-31 DIAGNOSIS — E785 Hyperlipidemia, unspecified: Secondary | ICD-10-CM | POA: Diagnosis not present

## 2015-03-31 DIAGNOSIS — Z9889 Other specified postprocedural states: Secondary | ICD-10-CM | POA: Insufficient documentation

## 2015-03-31 DIAGNOSIS — M47817 Spondylosis without myelopathy or radiculopathy, lumbosacral region: Secondary | ICD-10-CM | POA: Diagnosis present

## 2015-03-31 DIAGNOSIS — E118 Type 2 diabetes mellitus with unspecified complications: Secondary | ICD-10-CM | POA: Diagnosis not present

## 2015-03-31 NOTE — Patient Instructions (Signed)
Prior note from October 2015 discusses back pain and [employer].

## 2015-03-31 NOTE — Progress Notes (Signed)
Subjective:    Patient ID: Connor Holt, male    DOB: 01-16-1965, 50 y.o.   MRN: [id number] 05/27/14-RightL5 dorsal ramus., Right L4 and Right L3 medial branch radio frequency neurotomy under fluoroscopic guidance  HPI No walking limitation, using standing desk, trying to work from home more Some diabetic issues, losing sense of hypoglycemia, has needed glucagon injection Evaluation for dizzy episodes, cardiology w/u  Some memory loss with   Asking whether his back pain might be service related Pain Inventory Average Pain 4 Pain Right Now 3 My pain is burning, dull, tingling and aching  In the last 24 hours, has pain interfered with the following? General activity no selection Relation with others no selection Enjoyment of life no selection What TIME of day is your pain at its worst? varies Sleep (in general) Poor  Pain is worse with: bending, sitting, standing and some activites Pain improves with: rest, heat/ice, therapy/exercise, pacing activities and medication Relief from Meds: 7  Mobility walk without assistance how many minutes can you walk? 20 ability to climb steps?  yes do you drive?  yes Do you have any goals in this area?  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  yes  Neuro/Psych anxiety  Prior Studies Any changes since last visit?  no  Physicians involved in your care Any changes since last visit?  yes Primary care Dr. Raphael Gibney   Family History  Problem Relation Age of Onset  . Diabetes Father    Social History   Social History  . Marital Status: Married    Spouse Name: N/A  . Number of Children: N/A  . Years of Education: N/A   Social History Main Topics  . Smoking status: Never Smoker   . Smokeless tobacco: Current User    Types: Snuff  . Alcohol Use: No  . Drug Use: No  . Sexual Activity: Not Asked   Other Topics Concern  . None   Social History  Narrative   Past Surgical History  Procedure Laterality Date  . Hernia repair    . Cardiac catheterization     Past Medical History  Diagnosis Date  . Anxiety   . Diabetes mellitus   . Low back pain   . Dyslipidemia   . Hypothyroidism    BP 136/80 mmHg  Pulse 68  Resp 14  SpO2 95%  Opioid Risk Score:   Fall Risk Score:  `1  Depression screen PHQ 2/9  Depression screen [facility] 2/9 03/31/2015 09/30/2014  Decreased Interest 0 0  Down, Depressed, Hopeless 0 0  PHQ - 2 Score 0 0  Altered sleeping 2 -  Tired, decreased energy 1 -  Change in appetite 1 -  Feeling bad or failure about yourself  0 -  Trouble concentrating 0 -  Moving slowly or fidgety/restless 1 -  Suicidal thoughts 0 -  PHQ-9 Score 5 -     Review of Systems  All other systems reviewed and are negative.      Objective:   Physical Exam  Constitutional: He is oriented to person, place, and time. He appears well-developed and well-nourished.  HENT:  Head: Normocephalic and atraumatic.  Eyes: Conjunctivae and EOM are normal. Pupils are equal, round, and reactive to light.  Neurological: He is alert and oriented to person, place, and time.  Psychiatric: He has a normal mood and affect.  Nursing note and vitals reviewed.  Patient has no tenderness palpation lumbar paraspinal muscles. There is some pain with extension lumbar spine. His lumbar range of motion however is good with flexion extension lateral bending and rotation Negative straight leg raising Ambulation without evidence of drag or knee instability.       Assessment & Plan:  1. Lumbar spondylosis as discussed in prior note October 2015 this is probably connected with his service in [employer]. He does respond to tramadol 300 mg extended release per day, [facility] He also benefits from lumbar radiofrequency ablation. No need for repeat ablation yet he is about 10 months post. He will follow-up in 6 months. He  will call for repeat radiofrequency procedure if his pain worsens prior to the follow-up appointment.

## 2015-04-27 ENCOUNTER — Telehealth: Payer: Self-pay | Admitting: Physical Medicine & Rehabilitation

## 2015-04-27 NOTE — Telephone Encounter (Signed)
Connor [place] [facility] [place] to let us know that the patients refill on Tramadol, they do not have the capsules and wanted to know if they could give him the tablets.  Please call her at [phone].  Patient is going out of town tomorrow and would like this refilled.

## 2015-04-27 NOTE — Telephone Encounter (Signed)
Approval for tablets called to [facility]. Message left on general voice box for pharmacy

## 2015-06-22 ENCOUNTER — Other Ambulatory Visit: Payer: Self-pay | Admitting: Physical Medicine & Rehabilitation

## 2015-07-20 ENCOUNTER — Other Ambulatory Visit: Payer: Self-pay | Admitting: Physical Medicine & Rehabilitation

## 2015-07-21 NOTE — Telephone Encounter (Signed)
Thayer OhmChris Egloff's needs a refill on Ultram [place] [facility] [place].  Any questions call him at [phone].

## 2015-07-22 ENCOUNTER — Other Ambulatory Visit: Payer: Self-pay | Admitting: Physical Medicine & Rehabilitation

## 2015-08-04 ENCOUNTER — Telehealth: Payer: Self-pay | Admitting: Physical Medicine & Rehabilitation

## 2015-08-04 NOTE — Telephone Encounter (Signed)
Please schedule an appointment for this patient to accommodate getting his prescriptions prior to his vacation. Please and thank you.

## 2015-08-04 NOTE — Telephone Encounter (Signed)
Patient would like to get his prescription for Ultram hand written for next month so he can take with him to [place] to have it filled.  Patient will be going on vacation from August 5-13 and would like that prescription ready on August 4.  Please call him with any questions [phone].

## 2015-08-07 NOTE — Telephone Encounter (Signed)
Patient was called Left voicemail Need to move patients 6 mo appt sooner so he can be seen and pick up script all at once

## 2015-08-10 ENCOUNTER — Telehealth: Payer: Self-pay | Admitting: Physical Medicine & Rehabilitation

## 2015-08-10 NOTE — Telephone Encounter (Signed)
Pt will be out of Tramadol by August 10th. Spoke with pharmacy, authorized early refill on August 3rd. Left message to make pt aware.

## 2015-08-10 NOTE — Telephone Encounter (Signed)
Patient will be out of town when his next prescription for Tramadol will need to be filled.  She would like for someone to call his pharmacy to let them know it would be okay to pick it up on August 3, because they will leave to go out of town and will not be back until August 12.

## 2015-10-01 ENCOUNTER — Encounter: Attending: Physical Medicine & Rehabilitation

## 2015-10-01 ENCOUNTER — Encounter: Payer: Self-pay | Admitting: Physical Medicine & Rehabilitation

## 2015-10-01 ENCOUNTER — Ambulatory Visit (HOSPITAL_BASED_OUTPATIENT_CLINIC_OR_DEPARTMENT_OTHER): Admitting: Physical Medicine & Rehabilitation

## 2015-10-01 VITALS — BP 125/88 | HR 107 | Resp 14

## 2015-10-01 DIAGNOSIS — M47817 Spondylosis without myelopathy or radiculopathy, lumbosacral region: Secondary | ICD-10-CM

## 2015-10-01 DIAGNOSIS — E039 Hypothyroidism, unspecified: Secondary | ICD-10-CM | POA: Insufficient documentation

## 2015-10-01 DIAGNOSIS — E119 Type 2 diabetes mellitus without complications: Secondary | ICD-10-CM | POA: Insufficient documentation

## 2015-10-01 DIAGNOSIS — F419 Anxiety disorder, unspecified: Secondary | ICD-10-CM | POA: Diagnosis not present

## 2015-10-01 DIAGNOSIS — M47816 Spondylosis without myelopathy or radiculopathy, lumbar region: Secondary | ICD-10-CM | POA: Insufficient documentation

## 2015-10-01 NOTE — Patient Instructions (Signed)
Keep up the exercise.  

## 2015-10-01 NOTE — Progress Notes (Signed)
Subjective:    Patient ID: Connor Holt, male    DOB: 17-Dec-1965, 50 y.o.   MRN: [id number]  HPI Lost 30lb on Nutra system diet, walking 3-4 times a week 1.5 Marvis Moeller Diabetes management complicated asymptomatic hypoglycemia Heating pad use has declined not daily  Looking at new diabetes pump Back pain has already improved after weight loss. Activity tolerance has improved. We discussed sleeping position and avoidance of sleeping on his stomach as well as if he is on his side to make sure his spine is straight. He mainly sleeps on a couch. Even though he has a Tempur-Pedic mattress.  Pain Inventory Average Pain 3 Pain Right Now 3 My pain is intermittent, burning, dull and aching  In the last 24 hours, has pain interfered with the following? General activity 2 Relation with others 2 Enjoyment of life 2 What TIME of day is your pain at its worst? morning, evening Sleep (in general) Fair  Pain is worse with: bending, sitting, inactivity, standing and some activites Pain improves with: rest, heat/ice, therapy/exercise, pacing activities and medication Relief from Meds: no selection  Mobility walk without assistance how many minutes can you walk? 20-30 ability to climb steps?  yes do you drive?  yes Do you have any goals in this area?  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  no  Physicians involved in your care Any changes since last visit?  no   Family History  Problem Relation Age of Onset  . Diabetes Father    Social History   Social History  . Marital status: Married    Spouse name: N/A  . Number of children: N/A  . Years of education: N/A   Social History Main Topics  . Smoking status: Never Smoker  . Smokeless tobacco: Current User    Types: Snuff  . Alcohol use No  . Drug use: No  . Sexual activity: Not Asked   Other Topics  Concern  . None   Social History Narrative  . None   Past Surgical History:  Procedure Laterality Date  . CARDIAC CATHETERIZATION    . HERNIA REPAIR     Past Medical History:  Diagnosis Date  . Anxiety   . Diabetes mellitus   . Dyslipidemia   . Hypothyroidism   . Low back pain    BP 125/88 (BP Location: Left Arm, Patient Position: Sitting, Cuff Size: Large)   Pulse (!) 107   Resp 14   SpO2 96%   Opioid Risk Score:   Fall Risk Score:  `1  Depression screen PHQ 2/9  Depression screen [facility] 2/9 03/31/2015 09/30/2014  Decreased Interest 0 0  Down, Depressed, Hopeless 0 0  PHQ - 2 Score 0 0  Altered sleeping 2 -  Tired, decreased energy 1 -  Change in appetite 1 -  Feeling bad or failure about yourself  0 -  Trouble concentrating 0 -  Moving slowly or fidgety/restless 1 -  Suicidal thoughts 0 -  PHQ-9 Score 5 -    Review of Systems  Constitutional: Negative.   HENT: Negative.   Eyes: Negative.   Respiratory: Negative.   Cardiovascular: Negative.   Gastrointestinal: Negative.   Endocrine: Negative.   Genitourinary: Negative.   Musculoskeletal: Negative.   Skin: Negative.   Allergic/Immunologic: Negative.   Neurological: Negative.   Hematological: Negative.   Psychiatric/Behavioral: Negative.  Objective:   Physical Exam  Constitutional: He is oriented to person, place, and time. He appears well-developed and well-nourished.  HENT:  Head: Normocephalic and atraumatic.  Neurological: He is alert and oriented to person, place, and time.  Psychiatric: He has a normal mood and affect.  Nursing note and vitals reviewed.  No tenderness to palpation over lumbar paraspinal area. There is pain with lumbar extension. He is 75% extension and 100% flexion. Lateral bending is 75% and hurts more, going to the left and to the right. Negative straight leg raising. Normal strength in the upper and lower limbs. Gait is without evidence for  knee instability.  Skin  shows discoloration at the lumbosacral junction but no longer in the upper lumbar area.     Assessment & Plan:  1.  Lumbar spondylosis without radiculopathy Overall improving after weight loss At this point does not need repeat medial branch block, even though it has been almost 18 months. His right side is generally more painful than the left, even though that is the more recent radiofrequency procedure  He has been able to come off of scheduled II opioids. He continues on tramadol ER 300 mg per day

## 2016-01-28 ENCOUNTER — Other Ambulatory Visit: Payer: Self-pay | Admitting: Physical Medicine & Rehabilitation

## 2016-02-23 ENCOUNTER — Other Ambulatory Visit: Payer: Self-pay | Admitting: Physical Medicine & Rehabilitation

## 2016-03-29 ENCOUNTER — Encounter: Attending: Physical Medicine & Rehabilitation

## 2016-03-29 ENCOUNTER — Ambulatory Visit (HOSPITAL_BASED_OUTPATIENT_CLINIC_OR_DEPARTMENT_OTHER): Admitting: Physical Medicine & Rehabilitation

## 2016-03-29 ENCOUNTER — Encounter: Payer: Self-pay | Admitting: Physical Medicine & Rehabilitation

## 2016-03-29 VITALS — BP 130/87 | HR 88

## 2016-03-29 DIAGNOSIS — Z9889 Other specified postprocedural states: Secondary | ICD-10-CM | POA: Insufficient documentation

## 2016-03-29 DIAGNOSIS — E039 Hypothyroidism, unspecified: Secondary | ICD-10-CM | POA: Insufficient documentation

## 2016-03-29 DIAGNOSIS — M47817 Spondylosis without myelopathy or radiculopathy, lumbosacral region: Secondary | ICD-10-CM | POA: Insufficient documentation

## 2016-03-29 DIAGNOSIS — E785 Hyperlipidemia, unspecified: Secondary | ICD-10-CM | POA: Diagnosis not present

## 2016-03-29 DIAGNOSIS — E119 Type 2 diabetes mellitus without complications: Secondary | ICD-10-CM | POA: Insufficient documentation

## 2016-03-29 DIAGNOSIS — G8929 Other chronic pain: Secondary | ICD-10-CM | POA: Insufficient documentation

## 2016-03-29 DIAGNOSIS — Z833 Family history of diabetes mellitus: Secondary | ICD-10-CM | POA: Diagnosis not present

## 2016-03-29 NOTE — Progress Notes (Signed)
Subjective:    Patient ID: Connor KinsmanChristopher D Holt, male    DOB: [birth year], 51 y.o.   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  Patient is concerned that long care activities may aggravate his back pain  The patient continues to work on weight loss, has lost 30 pounds.   Other concerns include feeling lightheaded when standing. Pain Inventory Average Pain 3 Pain Right Now 3 My pain is intermittent, burning, dull and aching  In the last 24 hours, has pain interfered with the following? General activity 0 Relation with others 0 Enjoyment of life 0 What TIME of day is your pain at its worst? . Sleep (in general) .  Pain is worse with: bending, inactivity, standing and some activites Pain improves with: rest, heat/ice, therapy/exercise, pacing activities and medication Relief from Meds: 5  Mobility walk without assistance ability to climb steps?  yes do you drive?  yes  Function employed # of hrs/week 40  Neuro/Psych No problems in this area  Prior Studies Any changes since last visit?  no  Physicians involved in your care Any changes since last visit?  no   Family History  Problem Relation Age of Onset  . Diabetes Father    Social History   Social History  . Marital status: Married    Spouse name: N/A  . Number of children: N/A  . Years of education: N/A   Social History Main Topics  . Smoking status: Never Smoker  . Smokeless tobacco: Current User    Types: Snuff  . Alcohol use No  . Drug use: No  . Sexual activity: Not on file   Other Topics Concern  . Not on file   Social History Narrative  . No narrative on file   Past Surgical History:  Procedure Laterality Date  . CARDIAC CATHETERIZATION    . HERNIA REPAIR     Past Medical History:  Diagnosis Date  . Anxiety   . Diabetes mellitus   . Dyslipidemia   . Hypothyroidism    . Low back pain    There were no vitals taken for this visit.  Opioid Risk Score:   Fall Risk Score:  `1  Depression screen PHQ 2/9  [facility] 2/[facility] 03/31/2015 09/30/2014  Decreased Interest 0 0  Down, Depressed, Hopeless 0 0  PHQ - 2 Score 0 0  Altered sleeping 2 -  Tired, decreased energy 1 -  Change in appetite 1 -  Feeling bad or failure about yourself  0 -  Trouble concentrating 0 -  Moving slowly or fidgety/restless 1 -  Suicidal thoughts 0 -  PHQ-9 Score 5 -    Review of Systems  Constitutional: Negative.   HENT: Negative.   Eyes: Negative.   Respiratory: Negative.   Cardiovascular: Negative.   Gastrointestinal: Negative.   Endocrine: Negative.   Genitourinary: Negative.   Musculoskeletal: Negative.   Skin: Negative.   Allergic/Immunologic: Negative.   Neurological: Negative.   Hematological: Negative.   Psychiatric/Behavioral: Negative.   All other systems reviewed and are negative.      Objective:   Physical Exam  Constitutional: He is oriented to person, place, and time. He appears well-developed and well-nourished.  HENT:  Head: Normocephalic and atraumatic.  Eyes: Conjunctivae and EOM are normal. Pupils are equal, round, and reactive to light.  Neurological: He is alert and oriented to  person, place, and time.  Psychiatric: He has a normal mood and affect.  Nursing note and vitals reviewed. No tenderness to palpation in the thoracic or lumbar spine. Negative straight leg raising. Ambulates without evidence of toe drag or knee instability        Assessment & Plan:  1.  Chronic low back pain lumbar spondylosis, At this point. No need for repeat radiofrequency neurotomy. Continue tramadol 300 mg extended release daily. We discussed other treatment options including Cymbalta  2.  Hx vertigo-neuro eval in am, cardiac eval neg, may be autonomic Discussed holding partial squats for couple minutes each morning. Discussed thigh-high TED  hose. Discussed adequate hydration

## 2016-03-29 NOTE — Patient Instructions (Signed)

## 2016-08-08 ENCOUNTER — Other Ambulatory Visit: Payer: Self-pay | Admitting: Physical Medicine & Rehabilitation

## 2016-08-29 ENCOUNTER — Encounter: Attending: Physical Medicine & Rehabilitation

## 2016-08-29 ENCOUNTER — Ambulatory Visit (HOSPITAL_BASED_OUTPATIENT_CLINIC_OR_DEPARTMENT_OTHER): Admitting: Physical Medicine & Rehabilitation

## 2016-08-29 ENCOUNTER — Encounter: Payer: Self-pay | Admitting: Physical Medicine & Rehabilitation

## 2016-08-29 VITALS — BP 135/88 | HR 78

## 2016-08-29 DIAGNOSIS — Z720 Tobacco use: Secondary | ICD-10-CM | POA: Insufficient documentation

## 2016-08-29 DIAGNOSIS — E785 Hyperlipidemia, unspecified: Secondary | ICD-10-CM | POA: Diagnosis not present

## 2016-08-29 DIAGNOSIS — E119 Type 2 diabetes mellitus without complications: Secondary | ICD-10-CM | POA: Insufficient documentation

## 2016-08-29 DIAGNOSIS — M47896 Other spondylosis, lumbar region: Secondary | ICD-10-CM | POA: Diagnosis not present

## 2016-08-29 DIAGNOSIS — M47817 Spondylosis without myelopathy or radiculopathy, lumbosacral region: Secondary | ICD-10-CM | POA: Diagnosis not present

## 2016-08-29 DIAGNOSIS — F419 Anxiety disorder, unspecified: Secondary | ICD-10-CM | POA: Diagnosis not present

## 2016-08-29 DIAGNOSIS — Z9889 Other specified postprocedural states: Secondary | ICD-10-CM | POA: Insufficient documentation

## 2016-08-29 DIAGNOSIS — M545 Low back pain: Secondary | ICD-10-CM | POA: Diagnosis not present

## 2016-08-29 MED ORDER — TRAMADOL HCL ER 300 MG PO TB24: 300.0000 mg | ORAL_TABLET | Freq: Every day | ORAL | 5 refills | Status: DC

## 2016-08-29 NOTE — Patient Instructions (Signed)

## 2016-08-29 NOTE — Progress Notes (Signed)
Subjective:    Patient ID: Connor Holt, male    DOB: 01/27/1965, 51 y.o.   MRN: [id number]  HPI   51 year old male with history of lumbar spondylosis, his last lumbar radiofrequency neurotomy was approximately 2 years ago. Overall, his back pain has improved since he has lost weight and has become more active. Receiving retirement benefits from [place]  Doing landscaping 2 x per day  New insulin pump Diagnosed with autonomic neuropathy, off lisinopril Has profuse sweating even I normal temps Some dizziness as well.  Thinking about real estate investment  Started dieting and increased activity level Lost 45 lb total   Pain Inventory Average Pain 3 Pain Right Now 2 My pain is burning, dull and aching  In the last 24 hours, has pain interfered with the following? General activity 3 Relation with others 1 Enjoyment of life 2 What TIME of day is your pain at its worst? morning Sleep (in general) Fair  Pain is worse with: bending and standing Pain improves with: rest, heat/ice, therapy/exercise, pacing activities and medication Relief from Meds: 7  Mobility walk without assistance ability to climb steps?  yes do you drive?  yes  Function employed # of hrs/week 30  Neuro/Psych No problems in this area  Prior Studies Any changes since last visit?  no  Physicians involved in your care Any changes since last visit?  no   Family History  Problem Relation Age of Onset  . Diabetes Father    Social History   Social History  . Marital status: Married    Spouse name: N/A  . Number of children: N/A  . Years of education: N/A   Social History Main Topics  . Smoking status: Never Smoker  . Smokeless tobacco: Current User    Types: Snuff  . Alcohol use No  . Drug use: No  . Sexual activity: Not on file   Other Topics Concern  . Not on file   Social History Narrative  . No narrative on file   Past Surgical History:  Procedure Laterality Date  .  CARDIAC CATHETERIZATION    . HERNIA REPAIR     Past Medical History:  Diagnosis Date  . Anxiety   . Diabetes mellitus   . Dyslipidemia   . Hypothyroidism   . Low back pain    There were no vitals taken for this visit.  Opioid Risk Score:   Fall Risk Score:  `1  Depression screen PHQ 2/9  Depression screen [facility] 2/9 03/31/2015 09/30/2014  Decreased Interest 0 0  Down, Depressed, Hopeless 0 0  PHQ - 2 Score 0 0  Altered sleeping 2 -  Tired, decreased energy 1 -  Change in appetite 1 -  Feeling bad or failure about yourself  0 -  Trouble concentrating 0 -  Moving slowly or fidgety/restless 1 -  Suicidal thoughts 0 -  PHQ-9 Score 5 -     Review of Systems  Constitutional: Negative.   HENT: Negative.   Eyes: Negative.   Respiratory: Negative.   Cardiovascular: Negative.   Gastrointestinal: Negative.   Endocrine: Negative.   Genitourinary: Negative.   Musculoskeletal: Negative.   Skin: Negative.   Allergic/Immunologic: Negative.   Neurological: Negative.   Hematological: Negative.   Psychiatric/Behavioral: Negative.   All other systems reviewed and are negative.      Objective:   Physical Exam  Constitutional: He is oriented to person, place, and time. He appears well-developed and well-nourished.  HENT:  Head:  Normocephalic and atraumatic.  Eyes: Pupils are equal, round, and reactive to light. Conjunctivae and EOM are normal.  Neurological: He is alert and oriented to person, place, and time.  Psychiatric: He has a normal mood and affect.  Nursing note and vitals reviewed.  Attendance palpation lumbar paraspinal muscles. He has full range of motion lumbar flexion, extension, lateral bending and rotation. Skin over the lumbosacral junction. Still has some burn marks from eating pad. Negative straight leg raising. Motor strength is 5/5 bilateral hip flexors, knee extensors, ankle dorsi flexion. Gait without evidence of toe drag or knee instability         Assessment & Plan:  1.  Lumbar spondylosis- No evidence of radiculopathy or myelopathy. Overall he has made improvements with his lumbar flexibility and mobility due to weight loss as well as increased exercise. No longer takes oxycodone through the [place]. Continue tramadol ER 300 mg per day If he continues to well next visit. May consider going down to 200 mg per day

## 2017-01-25 DIAGNOSIS — M25511 Pain in right shoulder: Secondary | ICD-10-CM | POA: Insufficient documentation

## 2017-02-13 ENCOUNTER — Ambulatory Visit (HOSPITAL_BASED_OUTPATIENT_CLINIC_OR_DEPARTMENT_OTHER): Admitting: Physical Medicine & Rehabilitation

## 2017-02-13 ENCOUNTER — Encounter: Payer: Self-pay | Admitting: Physical Medicine & Rehabilitation

## 2017-02-13 ENCOUNTER — Encounter: Attending: Physical Medicine & Rehabilitation

## 2017-02-13 VITALS — BP 150/100 | HR 99

## 2017-02-13 DIAGNOSIS — Z79899 Other long term (current) drug therapy: Secondary | ICD-10-CM | POA: Diagnosis not present

## 2017-02-13 DIAGNOSIS — Z794 Long term (current) use of insulin: Secondary | ICD-10-CM | POA: Diagnosis not present

## 2017-02-13 DIAGNOSIS — Z79890 Hormone replacement therapy (postmenopausal): Secondary | ICD-10-CM | POA: Insufficient documentation

## 2017-02-13 DIAGNOSIS — E119 Type 2 diabetes mellitus without complications: Secondary | ICD-10-CM | POA: Insufficient documentation

## 2017-02-13 DIAGNOSIS — M545 Low back pain: Secondary | ICD-10-CM | POA: Insufficient documentation

## 2017-02-13 DIAGNOSIS — G8929 Other chronic pain: Secondary | ICD-10-CM | POA: Diagnosis present

## 2017-02-13 DIAGNOSIS — E785 Hyperlipidemia, unspecified: Secondary | ICD-10-CM | POA: Diagnosis not present

## 2017-02-13 DIAGNOSIS — F419 Anxiety disorder, unspecified: Secondary | ICD-10-CM | POA: Insufficient documentation

## 2017-02-13 DIAGNOSIS — E039 Hypothyroidism, unspecified: Secondary | ICD-10-CM | POA: Diagnosis not present

## 2017-02-13 MED ORDER — METHOCARBAMOL 500 MG PO TABS
500.0000 mg | ORAL_TABLET | Freq: Every day | ORAL | 0 refills | Status: DC
Start: 2017-02-13 — End: 2019-06-13

## 2017-02-13 MED ORDER — TRAMADOL HCL ER 300 MG PO TB24: 300.0000 mg | ORAL_TABLET | Freq: Every day | ORAL | 5 refills | Status: DC

## 2017-02-13 NOTE — Progress Notes (Signed)
Subjective:    Patient ID: Connor Holt, male    DOB: 1965/05/18, 52 y.o.   MRN: [id number]  HPI   52 year old male with history of chronic lumbar pain.  He has been treated for lumbar spondylosis and has responded well in the past to radiofrequency neurotomies.  He also gives a history of prior annular tear. Patient has been off oxycodone for about a year. He continues take tramadol 300 mg daily of the extended release formulation  Returns with a 3-day history of increased back pain Fixing truck, he was affixing the bumper was up under the truck.  He did not note any injury at the time however the next day he woke up with severe pain. Midline low back No lower ext pain, no leg weakness no bowel or bladder dysfunction Has been trying heat as well as ibuprofen 600 mg 1 day and Aleve 2 tablets the next day with no significant relief Pain Inventory Average Pain 7 Pain Right Now 8 My pain is burning  In the last 24 hours, has pain interfered with the following? General activity 8 Relation with others 8 Enjoyment of life 8 What TIME of day is your pain at its worst? evening Sleep (in general) Poor  Pain is worse with: bending, sitting, inactivity and some activites Pain improves with: medication Relief from Meds: 3  Mobility walk without assistance ability to climb steps?  yes do you drive?  yes  Function employed # of hrs/week .  Neuro/Psych depression anxiety  Prior Studies Any changes since last visit?  no  Physicians involved in your care Any changes since last visit?  no   Family History  Problem Relation Age of Onset  . Diabetes Father    Social History   Socioeconomic History  . Marital status: Married    Spouse name: Not on file  . Number of children: Not on file  . Years of education: Not on file  . Highest education level: Not on file  Social Needs  . Financial resource strain: Not on file  . Food insecurity - worry: Not on file  . Food  insecurity - inability: Not on file  . Transportation needs - medical: Not on file  . Transportation needs - non-medical: Not on file  Occupational History  . Not on file  Tobacco Use  . Smoking status: Never Smoker  . Smokeless tobacco: Current User    Types: Snuff  Substance and Sexual Activity  . Alcohol use: No  . Drug use: No  . Sexual activity: Not on file  Other Topics Concern  . Not on file  Social History Narrative  . Not on file   Past Surgical History:  Procedure Laterality Date  . CARDIAC CATHETERIZATION    . HERNIA REPAIR     Past Medical History:  Diagnosis Date  . Anxiety   . Diabetes mellitus   . Dyslipidemia   . Hypothyroidism   . Low back pain    There were no vitals taken for this visit.  Opioid Risk Score:   Fall Risk Score:  `1  Depression screen PHQ 2/9  [facility] 03/31/2015 09/30/2014  Decreased Interest 0 0  Down, Depressed, Hopeless 0 0  PHQ - 2 Score 0 0  Altered sleeping 2 -  Tired, decreased energy 1 -  Change in appetite 1 -  Feeling bad or failure about yourself  0 -  Trouble concentrating 0 -  Moving slowly or fidgety/restless 1 -  Suicidal thoughts 0 -  PHQ-9 Score 5 -     Review of Systems  Constitutional: Positive for unexpected weight change.  HENT: Negative.   Eyes: Negative.   Respiratory: Negative.   Cardiovascular: Negative.   Gastrointestinal: Negative.   Endocrine: Negative.   Genitourinary: Negative.   Musculoskeletal: Negative.   Skin: Negative.   Allergic/Immunologic: Negative.   Neurological: Negative.   Hematological: Negative.   Psychiatric/Behavioral: Negative.   All other systems reviewed and are negative.      Objective:   Physical Exam  Constitutional: He is oriented to person, place, and time. He appears well-developed and well-nourished. No distress.  HENT:  Head: Normocephalic and atraumatic.  Eyes: Pupils are equal, round, and reactive to light.  Neck: Normal range of  motion.  Musculoskeletal:  Lumbar range of motion reduced 25% flexion extension lateral bending and rotation.  Negative straight leg raise test  Neurological: He is alert and oriented to person, place, and time. Coordination normal.  Reflex Scores:      Patellar reflexes are 2+ on the right side and 2+ on the left side.      Achilles reflexes are 2+ on the right side and 2+ on the left side. Motor strength is 5/5 bilateral hip flexor knee extensor ankle dorsiflexor  Sensation intact pinprick bilateral L2-L3-L4 L5-S1 dermatome distribution   Skin: He is not diaphoretic.  Nursing note and vitals reviewed.         Assessment & Plan:  Acute exacerbation of chronic low back pain related to activity at home.  No evidence of lumbar radiculopathy.  No evidence of cauda equina syndrome Recommend anti-inflammatory such as Aleve 2 tablets twice a day for 1 week with food Methocarbamol 500 mg p.o. nightly should help with sleep Have printed out some activities would avoid lumbar flexion such as crunches or sit ups. Advised to remain active He will make an appointment if he does not prove over the next month. Description written for tramadol ER 300 mg daily is 1 of his chronic medications for chronic low back pain.  Follow-up in 6 months Reviewed PMP aware website no red flags

## 2017-02-13 NOTE — Patient Instructions (Signed)

## 2017-03-02 ENCOUNTER — Ambulatory Visit: Admitting: Physical Medicine & Rehabilitation

## 2017-03-27 DIAGNOSIS — M19019 Primary osteoarthritis, unspecified shoulder: Secondary | ICD-10-CM | POA: Insufficient documentation

## 2017-04-13 ENCOUNTER — Encounter: Payer: Self-pay | Admitting: Physical Medicine & Rehabilitation

## 2017-05-08 DIAGNOSIS — M25611 Stiffness of right shoulder, not elsewhere classified: Secondary | ICD-10-CM | POA: Insufficient documentation

## 2017-05-17 DIAGNOSIS — Z4789 Encounter for other orthopedic aftercare: Secondary | ICD-10-CM | POA: Insufficient documentation

## 2017-06-19 DIAGNOSIS — G5601 Carpal tunnel syndrome, right upper limb: Secondary | ICD-10-CM | POA: Insufficient documentation

## 2017-08-02 ENCOUNTER — Other Ambulatory Visit: Payer: Self-pay | Admitting: Physical Medicine & Rehabilitation

## 2017-08-14 ENCOUNTER — Encounter: Attending: Physical Medicine & Rehabilitation

## 2017-08-14 ENCOUNTER — Encounter: Payer: Self-pay | Admitting: Physical Medicine & Rehabilitation

## 2017-08-14 ENCOUNTER — Ambulatory Visit (HOSPITAL_BASED_OUTPATIENT_CLINIC_OR_DEPARTMENT_OTHER): Admitting: Physical Medicine & Rehabilitation

## 2017-08-14 VITALS — BP 147/103 | HR 98 | Ht 73.0 in | Wt 232.0 lb

## 2017-08-14 DIAGNOSIS — Z79899 Other long term (current) drug therapy: Secondary | ICD-10-CM | POA: Insufficient documentation

## 2017-08-14 DIAGNOSIS — Z5181 Encounter for therapeutic drug level monitoring: Secondary | ICD-10-CM

## 2017-08-14 DIAGNOSIS — M47816 Spondylosis without myelopathy or radiculopathy, lumbar region: Secondary | ICD-10-CM | POA: Insufficient documentation

## 2017-08-14 DIAGNOSIS — E1043 Type 1 diabetes mellitus with diabetic autonomic (poly)neuropathy: Secondary | ICD-10-CM | POA: Diagnosis not present

## 2017-08-14 MED ORDER — TRAMADOL HCL ER 100 MG PO TB24: 100.0000 mg | ORAL_TABLET | Freq: Every day | ORAL | 0 refills | Status: DC

## 2017-08-14 NOTE — Patient Instructions (Signed)
May consider thigh high compression hose for the orthostatic hypotension

## 2017-08-14 NOTE — Progress Notes (Signed)
Subjective:    Patient ID: Connor Holt, male    DOB: Sep 23, 1965, 52 y.o.   MRN: [id number]  HPI  52 year old male with type 1 diabetes who is followed at this clinic for lumbar spondylosis.  He has had good results in the past with lumbar radiofrequency neurotomy.  He has weaned off oxy[place] which [place] [facility].  He has continued on his tramadol extended release 300 mg/day.  He feels like he is doing reasonably well in terms of his level of functioning.  Unfortunately he is on disability now because of the diabetic peripheral neuropathy and now diagnosed with diabetic autonomic neuropathy with hypotension. Patient is followed by neurology. In addition patient underwent right shoulder surgery by Dr. Aundria Rudogers from orthopedics.  He had persistent postoperative pain and had an EMG which demonstrated right carpal tunnel syndrome.    Pain Inventory Average Pain 4 Pain Right Now 3 My pain is burning, dull and aching  In the last 24 hours, has pain interfered with the following? General activity 4 Relation with others 3 Enjoyment of life 3 What TIME of day is your pain at its worst? na Sleep (in general) na  Pain is worse with: bending, sitting and standing Pain improves with: heat/ice, therapy/exercise, pacing activities and medication Relief from Meds: na  Mobility walk without assistance ability to climb steps?  yes do you drive?  yes  Function disabled: date disabled .  Neuro/Psych weakness numbness tingling dizziness depression anxiety  Prior Studies Any changes since last visit?  no  Physicians involved in your care Any changes since last visit?  no   Family History  Problem Relation Age of Onset  . Diabetes Father    Social History   Socioeconomic History  . Marital status: Married    Spouse name: Not on file  . Number of children: Not on file  . Years of education: Not on file  . Highest education level: Not on file  Occupational  History  . Not on file  Social Needs  . Financial resource strain: Not on file  . Food insecurity:    Worry: Not on file    Inability: Not on file  . Transportation needs:    Medical: Not on file    Non-medical: Not on file  Tobacco Use  . Smoking status: Never Smoker  . Smokeless tobacco: Current User    Types: Snuff  Substance and Sexual Activity  . Alcohol use: No  . Drug use: No  . Sexual activity: Not on file  Lifestyle  . Physical activity:    Days per week: Not on file    Minutes per session: Not on file  . Stress: Not on file  Relationships  . Social connections:    Talks on phone: Not on file    Gets together: Not on file    Attends religious service: Not on file    Active member of club or organization: Not on file    Attends meetings of clubs or organizations: Not on file    Relationship status: Not on file  Other Topics Concern  . Not on file  Social History Narrative  . Not on file   Past Surgical History:  Procedure Laterality Date  . CARDIAC CATHETERIZATION    . HERNIA REPAIR     Past Medical History:  Diagnosis Date  . Anxiety   . Diabetes mellitus   . Dyslipidemia   . Hypothyroidism   . Low back pain  There were no vitals taken for this visit.  Opioid Risk Score:   Fall Risk Score:  `1  Depression screen PHQ 2/9  Depression screen [facility] 2/9 03/31/2015 09/30/2014  Decreased Interest 0 0  Down, Depressed, Hopeless 0 0  PHQ - 2 Score 0 0  Altered sleeping 2 -  Tired, decreased energy 1 -  Change in appetite 1 -  Feeling bad or failure about yourself  0 -  Trouble concentrating 0 -  Moving slowly or fidgety/restless 1 -  Suicidal thoughts 0 -  PHQ-9 Score 5 -     Review of Systems  Constitutional: Negative.   HENT: Negative.   Eyes: Negative.   Respiratory: Positive for apnea and shortness of breath.   Cardiovascular: Negative.   Gastrointestinal: Positive for nausea and vomiting.  Endocrine: Negative.   Genitourinary:  Negative.   Musculoskeletal: Positive for arthralgias, back pain, joint swelling and myalgias.  Skin: Negative.   Allergic/Immunologic: Negative.   Neurological: Positive for dizziness and weakness.  Hematological: Negative.   Psychiatric/Behavioral: Positive for dysphoric mood. The patient is nervous/anxious.   All other systems reviewed and are negative.      Objective:   Physical Exam  Constitutional: He is oriented to person, place, and time. He appears well-developed and well-nourished.  HENT:  Head: Normocephalic and atraumatic.  Eyes: Pupils are equal, round, and reactive to light. EOM are normal.  Neurological: He is alert and oriented to person, place, and time.  Skin: Skin is warm and dry.  Psychiatric: He has a normal mood and affect.  Nursing note and vitals reviewed. Lumbar spine range of motion is 75% flexion extension lateral bending and rotation There is no tenderness palpation along the lumbar spine. Negative straight leg raising Sensation reduced to pinprick below the knee bilaterally in the lower extremities.  Also reduced in the right second and third digits Motor strength is 5/5 bilateral hip flexion extension ankle dorsiflexion Gait is without evidence of toe drag or knee instability.        Assessment & Plan:  #1.  Lumbar spondylosis without myelopathy, overall doing well with stretching program.  He continues on tramadol 300 mg extended release daily however he would like to wean down off of this.  We discussed that we will prescribe tramadol 100 mg extended release and he is to take 2 tablets a day for 1 month then reduce to one tablet per day for 1 month and then discontinue. Because of this we will not check urine drug screen and renew controlled substance agreement.  If he decides that he is unable to wean off this medication will need to get him back in to perform these Physical medicine rehab follow-up in 1 year 2.  Diabetes with peripheral neuropathy  as well as autonomic neuropathy.  He has decreased sensation below the knees.  The carpal tunnel syndrome may be related to his diabetes as well given the increased incidence. In regards to his orthostatic hypotension recommend compression hose thigh-high

## 2017-09-01 ENCOUNTER — Encounter (HOSPITAL_BASED_OUTPATIENT_CLINIC_OR_DEPARTMENT_OTHER): Payer: Self-pay | Admitting: *Deleted

## 2017-09-01 ENCOUNTER — Other Ambulatory Visit: Payer: Self-pay

## 2017-09-01 ENCOUNTER — Emergency Department (HOSPITAL_BASED_OUTPATIENT_CLINIC_OR_DEPARTMENT_OTHER)

## 2017-09-01 ENCOUNTER — Emergency Department (HOSPITAL_BASED_OUTPATIENT_CLINIC_OR_DEPARTMENT_OTHER)
Admission: EM | Admit: 2017-09-01 | Discharge: 2017-09-01 | Disposition: A | Discharge status: Home / Self Care | Attending: Emergency Medicine | Admitting: Emergency Medicine

## 2017-09-01 DIAGNOSIS — R51 Headache: Secondary | ICD-10-CM | POA: Diagnosis present

## 2017-09-01 DIAGNOSIS — E109 Type 1 diabetes mellitus without complications: Secondary | ICD-10-CM | POA: Diagnosis not present

## 2017-09-01 DIAGNOSIS — Z794 Long term (current) use of insulin: Secondary | ICD-10-CM | POA: Insufficient documentation

## 2017-09-01 DIAGNOSIS — E039 Hypothyroidism, unspecified: Secondary | ICD-10-CM | POA: Insufficient documentation

## 2017-09-01 DIAGNOSIS — Z79899 Other long term (current) drug therapy: Secondary | ICD-10-CM | POA: Insufficient documentation

## 2017-09-01 DIAGNOSIS — R519 Headache, unspecified: Secondary | ICD-10-CM

## 2017-09-01 LAB — CBC WITH DIFFERENTIAL/PLATELET
Basophils Absolute: 0.1 10*3/uL (ref 0.0–0.1)
Basophils Relative: 1 %
EOS ABS: 0.2 10*3/uL (ref 0.0–0.7)
Eosinophils Relative: 3 %
HEMATOCRIT: 41 % (ref 39.0–52.0)
Hemoglobin: 13.9 g/dL (ref 13.0–17.0)
LYMPHS ABS: 1.8 10*3/uL (ref 0.7–4.0)
Lymphocytes Relative: 23 %
MCH: 28.7 pg (ref 26.0–34.0)
MCHC: 33.9 g/dL (ref 30.0–36.0)
MCV: 84.7 fL (ref 78.0–100.0)
MONOS PCT: 10 %
Monocytes Absolute: 0.8 10*3/uL (ref 0.1–1.0)
NEUTROS ABS: 4.9 10*3/uL (ref 1.7–7.7)
Neutrophils Relative %: 63 %
Platelets: 285 10*3/uL (ref 150–400)
RBC: 4.84 MIL/uL (ref 4.22–5.81)
RDW: 13.2 % (ref 11.5–15.5)
WBC: 7.7 10*3/uL (ref 4.0–10.5)

## 2017-09-01 LAB — COMPREHENSIVE METABOLIC PANEL
ALT: 17 U/L (ref 0–44)
AST: 24 U/L (ref 15–41)
Albumin: 3.9 g/dL (ref 3.5–5.0)
Alkaline Phosphatase: 105 U/L (ref 38–126)
Anion gap: 7 (ref 5–15)
BUN: 11 mg/dL (ref 6–20)
CHLORIDE: 100 mmol/L (ref 98–111)
CO2: 27 mmol/L (ref 22–32)
CREATININE: 0.96 mg/dL (ref 0.61–1.24)
Calcium: 8.4 mg/dL — ABNORMAL LOW (ref 8.9–10.3)
GFR calc non Af Amer: >60 mL/min (ref >60)
Glucose, Bld: 349 mg/dL — ABNORMAL HIGH (ref 70–99)
Potassium: 4.2 mmol/L (ref 3.5–5.1)
SODIUM: 134 mmol/L — AB (ref 135–145)
Total Bilirubin: 0.6 mg/dL (ref 0.3–1.2)
Total Protein: 7 g/dL (ref 6.5–8.1)

## 2017-09-01 LAB — SEDIMENTATION RATE: Sed Rate: 11 mm/hr (ref 0–16)

## 2017-09-01 MED ORDER — DIPHENHYDRAMINE HCL 50 MG/ML IJ SOLN
25.0000 mg | Freq: Once | INTRAMUSCULAR | Status: AC
  Administered 2017-09-01: 25 mg via INTRAVENOUS
  Filled 2017-09-01: qty 1

## 2017-09-01 MED ORDER — SODIUM CHLORIDE 0.9 % IV BOLUS
1000.0000 mL | Freq: Once | INTRAVENOUS | Status: AC
  Administered 2017-09-01: 1000 mL via INTRAVENOUS

## 2017-09-01 MED ORDER — METOCLOPRAMIDE HCL 5 MG/ML IJ SOLN
10.0000 mg | Freq: Once | INTRAMUSCULAR | Status: AC
  Administered 2017-09-01: 10 mg via INTRAVENOUS
  Filled 2017-09-01: qty 2

## 2017-09-01 MED ORDER — KETOROLAC TROMETHAMINE 30 MG/ML IJ SOLN: 30.0000 mg | Freq: Once | INTRAMUSCULAR | Status: DC

## 2017-09-01 NOTE — ED Triage Notes (Signed)
Headache x 7 days. [facility] told [facility] come [facility].

## 2017-09-01 NOTE — ED Provider Notes (Signed)
[facility] EMERGENCY DEPARTMENT Provider Note   CSN: [id number] Arrival date & time: 09/01/17  1926     History   Chief Complaint Chief Complaint  Patient presents with  . Headache    HPI Connor Holt is a 52 y.o. male.  The history is provided by the patient and the spouse.     Connor Holt is a 52 y.o. male, with a history of type I DM, dyslipidemia, and hypothyroidism, presenting to the ED with headache for the past 7 days. Pain is described as a pressure, located at the top of the head and in the frontal region, 7/10, accompanied by intermittent shooting pain into the right temporal region.  He states the pain is fairly constant with short breaks from the pain "here and there."  He feels as though the pain is worsening. He states he typically does not get headaches.  Accompanied by nausea.  He was evaluated at the [place] 4 days ago, given Toradol and Fioricet.  Toradol improved the pain for a couple hours, but when patient awoke from a nap the pain was worse than it previously had been.  He had no improvement with the Fioricet. 3 days ago, he was seen at an urgent care who treated him for sinusitis with amoxicillin and prednisone.  He has had no improvement while taking these medications.  No known tick exposure or exposure to take continue environments. About a month ago, his Synthroid was adjusted and he thinks the dose was reduced.  Denies vomiting, fever, confusion, seizures, vision loss, focal neuro deficits, dizziness, congestion, rhinorrhea, or any other complaints.   Patient also notes that over the previous 6 months he has had intermittent episodes of diaphoresis, changes in appetite, and near syncope.  He has been followed by a neurologist at the [place], Dr. 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   Past Medical History:  Diagnosis Date  . Anxiety   . Diabetes mellitus   . Dyslipidemia   . Hypothyroidism   . Low back pain       Patient Active Problem List   Diagnosis Date Noted  . Wheezing 02/17/2012  . Lumbar spondylosis 07/05/2011  . Lumbosacral spondylosis without myelopathy 03/21/2011  . INGUINAL PAIN, RIGHT 12/22/2009  . VITAMIN B12 DEFICIENCY 08/06/2009  . VITAMIN D DEFICIENCY 08/06/2009  . SINUSITIS- ACUTE-NOS 08/06/2009  . FATIGUE 08/06/2009  . RASH-NONVESICULAR 08/06/2009  . 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 SKIN LESION 05/13/2009  . COUGH 10/28/2008  . ALLERGIC RHINITIS 03/25/2008  . HYPERCHOLESTEROLEMIA 12/29/2006  . POLYNEUROPATHY 12/29/2006  . HYPOTHYROIDISM 07/29/2006  . DIABETES MELLITUS, TYPE I 07/29/2006  . ANXIETY 07/29/2006    Past Surgical History:  Procedure Laterality Date  . CARDIAC CATHETERIZATION    . HERNIA REPAIR          Home Medications    Prior to Admission medications   Medication Sig Start Date End Date Taking? Authorizing Provider  cholecalciferol (VITAMIN D) 1000 UNITS tablet Take 1,000 Units by mouth daily.    [provider]  DULoxetine (CYMBALTA) 60 MG capsule Take 60 mg by mouth daily.    [provider]  gabapentin (NEURONTIN) 400 MG capsule Take 400 mg by mouth 3 (three) times daily.      [provider]  Insulin Human (INSULIN PUMP) SOLN Inject 0-100 each into the skin continuous. Varies with intake, meals, and exercise.    [provider]  levothyroxine (SYNTHROID, LEVOTHROID) 112 MCG tablet Take  224 mcg by mouth daily before breakfast. Empty stomach    [provider]  methocarbamol (ROBAXIN) 500 MG tablet Take 1 tablet (500 mg total) by mouth at bedtime. 02/13/17   Kirsteins, Victorino Sparrow, MD  ONE TOUCH ULTRA TEST test strip TEST BLOOD SUGAR 8 OR 9 TIMES DAILY AS DIRECTED 06/06/12   Romero Belling, MD  traMADol (ULTRAM-ER) 100 MG 24 hr tablet Take 1 tablet (100 mg total) by mouth daily. Take twice a day for the month of August then reduce to once a day in September then discontinue in October 08/14/17    Kirsteins, Victorino Sparrow, MD  vitamin B-12 (CYANOCOBALAMIN) 100 MCG tablet Take 100 mcg by mouth daily.    [provider]    Family History Family History  Problem Relation Age of Onset  . Diabetes Father     Social History Social History   Tobacco Use  . Smoking status: Never Smoker  . Smokeless tobacco: Current User    Types: Snuff  Substance Use Topics  . Alcohol use: No  . Drug use: No     Allergies   Patient has no known allergies.   Review of Systems Review of Systems  Constitutional: Negative for chills and fever.  HENT: Negative for congestion, facial swelling, rhinorrhea, sneezing, sore throat and trouble swallowing.   Eyes: Negative for visual disturbance.  Respiratory: Negative for shortness of breath.   Cardiovascular: Negative for chest pain.  Gastrointestinal: Positive for nausea. Negative for abdominal pain, diarrhea and vomiting.  Musculoskeletal: Negative for neck pain.  Neurological: Positive for headaches. Negative for dizziness, seizures, syncope, facial asymmetry, speech difficulty, weakness and numbness.  All other systems reviewed and are negative.    Physical Exam Updated Vital Signs BP (!) 144/88   Pulse 91   Temp 98.3 F (36.8 C) (Oral)   Resp 20   Ht 6\' 1"  (1.854 m)   Wt 105.2 kg   SpO2 100%   BMI 30.60 kg/m   Physical Exam  Constitutional: He is oriented to person, place, and time. He appears well-developed and well-nourished. No distress.  HENT:  Head: Normocephalic and atraumatic.  Right Ear: Tympanic membrane, external ear and ear canal normal.  Left Ear: Tympanic membrane, external ear and ear canal normal.  Nose: Nose normal. Right sinus exhibits no maxillary sinus tenderness and no frontal sinus tenderness. Left sinus exhibits no maxillary sinus tenderness and no frontal sinus tenderness.  Mouth/Throat: Oropharynx is clear and moist.  No tenderness to face, scalp, or temples. No noted rash or lesions.  Eyes: Pupils  are equal, round, and reactive to light. Conjunctivae and EOM are normal.  Neck: Normal range of motion. Neck supple.  Cardiovascular: Normal rate, regular rhythm, normal heart sounds and intact distal pulses.  Pulmonary/Chest: Effort normal and breath sounds normal. No respiratory distress.  Abdominal: Soft. There is no tenderness. There is no guarding.  Musculoskeletal: He exhibits no edema or tenderness.  Normal motor function intact in all extremities. No midline spinal tenderness.   Lymphadenopathy:    He has no cervical adenopathy.  Neurological: He is alert and oriented to person, place, and time.  Sensation grossly intact to light touch in the extremities. No noted speech deficits. No aphasia. Patient handles oral secretions without difficulty. No noted swallowing defects.  Equal grip strength bilaterally. Strength 5/5 in the upper extremities. Strength 5/5 with flexion and extension of the hips, knees, and ankles bilaterally.  Negative Romberg. No gait disturbance.  Coordination intact including heel  to shin and finger to nose.  Cranial nerves III-XII grossly intact.  No facial droop.   Skin: Skin is warm and dry. He is not diaphoretic.  Psychiatric: He has a normal mood and affect. His behavior is normal.  Nursing note and vitals reviewed.    ED Treatments / Results  Labs (all labs ordered are listed, but only abnormal results are displayed) Labs Reviewed  COMPREHENSIVE METABOLIC PANEL - Abnormal; Notable for the following components:      Result Value   Sodium 134 (*)    Glucose, Bld 349 (*)    Calcium 8.4 (*)    All other components within normal limits  CBC WITH DIFFERENTIAL/PLATELET  SEDIMENTATION RATE  TSH  RPR  C-REACTIVE PROTEIN  HIV ANTIBODY (ROUTINE TESTING)    EKG None  Radiology Ct Head Wo Contrast  Result Date: 09/01/2017 CLINICAL DATA:  52 y/o  M; 7 days of headache. EXAM: CT HEAD WITHOUT CONTRAST TECHNIQUE: Contiguous axial images were  obtained from the base of the skull through the vertex without intravenous contrast. COMPARISON:  None. FINDINGS: Brain: No evidence of acute infarction, hemorrhage, hydrocephalus, extra-axial collection or mass lesion/mass effect. Vascular: No hyperdense vessel or unexpected calcification. Skull: Normal. Negative for fracture or focal lesion. Sinuses/Orbits: No acute finding. Other: None. IMPRESSION: Negative CT of the head. Electronically Signed   By: Mitzi HansenLance  Furusawa-Stratton M.D.   On: 09/01/2017 21:50    Procedures Procedures (including critical care time)  Medications Ordered in ED Medications  sodium chloride 0.9 % bolus 1,000 mL (0 mLs Intravenous Stopped 09/01/17 2154)  metoCLOPramide (REGLAN) injection 10 mg (10 mg Intravenous Given 09/01/17 2057)  diphenhydrAMINE (BENADRYL) injection 25 mg (25 mg Intravenous Given 09/01/17 2149)     Initial Impression / Assessment and Plan / ED Course  I have reviewed the triage vital signs and the nursing notes.  Pertinent labs & imaging results that were available during my care of the patient were reviewed by me and considered in my medical decision making (see chart for details).  Clinical Course as of Sep 01 2349  Fri Sep 01, 2017  2217 Patient states this glucose reading is typical for him when he is taking steroids.  Glucose(!): 349 [SJ]    Clinical Course User Index [SJ] Cecillia Menees C, PA-C    Patient presents with a persistent headache.  He has no focal neurologic deficits.  Patient is nontoxic appearing, afebrile, not tachycardic, not tachypneic, not hypotensive, and is in no apparent distress.  He had significant improvement in his headache here in the ED.  He will follow-up with his neurologist, with whom he is already established. The patient was given instructions for home care as well as return precautions. Patient voices understanding of these instructions, accepts the plan, and is comfortable with discharge. Some of the patient's  labs are pending at discharge because they need to be sent out or require longer processing time.  Findings and plan of care discussed with Gwyneth SproutWhitney Plunkett, MD.   Vitals:   09/01/17 1930 09/01/17 1932 09/01/17 2153 09/01/17 2240  BP:  (!) 144/88 130/87 118/78  Pulse:  91 75 74  Resp:  20 18 16   Temp:  98.3 F (36.8 C)    TempSrc:  Oral    SpO2:  100% 98% 96%  Weight: 105.2 kg     Height: 6\' 1"  (1.854 m)        Final Clinical Impressions(s) / ED Diagnoses   Final diagnoses:  Persistent headaches  ED Discharge Orders    None       Concepcion Living 09/01/17 2356    Gwyneth Sprout, MD 09/02/17 [phone]

## 2017-09-01 NOTE — Discharge Instructions (Signed)
Headache Lab results and imaging were reassuring. For future headaches please try the following regimen: Antiinflammatory medications: Take 600 mg of ibuprofen every 6 hours or 440 mg (over the counter dose) to 500 mg (prescription dose) of naproxen every 12 hours for the next 3 days. After this time, these medications may be used as needed for pain. Take these medications with food to avoid upset stomach. Choose only one of these medications, do not take them together. Tylenol: Should you continue to have additional pain while taking the ibuprofen or naproxen, you may add in tylenol as needed. Your daily total maximum amount of tylenol from all sources should be limited to 4000mg /day for persons without liver problems, or 2000mg /day for those with liver problems.  Hydration: Have a goal of about a half liter of water every couple hours to stay well hydrated.   Sleep: Please be sure to get plenty of sleep with a goal of 8 hours per night. Having a regular bed time and bedtime routine can help with this.  Screens: Reduce the amount of time you are in front of screens.  Take about a 5-10-minute break every hour or every couple hours to give your eyes rest.  Do not use screens in dark rooms.  Glasses with a blue light filter may also help reduce eye fatigue.  Stress: Take steps to reduce stress as much as possible.   Follow up: Follow-up with your neurologist on this matter.

## 2017-09-01 NOTE — ED Notes (Signed)
Asked Pt. About poss. Contact with tick or tick bite and Pt. Said he does not remember being bitten by a tick.  Pt. Reports no visual disturbance and no vomiting.

## 2017-09-01 NOTE — ED Notes (Signed)
Pt. Reports headache for 7 days now.  Pt. Connor SpanielSaid he has seen his PCP and urgent care but no relief.  Pt. Reports he has sweating episodes and is now having some nausea.  Pt. In  No distress and reports he has the headache in the front and on the top of the head rating it a 08/18/08 with behind the eyes.

## 2017-09-02 LAB — TSH: TSH: 0.125 u[IU]/mL — AB (ref 0.350–4.500)

## 2017-09-02 LAB — C-REACTIVE PROTEIN: CRP: 1.6 mg/dL — ABNORMAL HIGH (ref <1.0)

## 2017-09-03 LAB — RPR: RPR Ser Ql: NONREACTIVE

## 2017-09-03 LAB — HIV ANTIBODY (ROUTINE TESTING W REFLEX): HIV SCREEN 4TH GENERATION: NONREACTIVE

## 2017-11-29 ENCOUNTER — Emergency Department (HOSPITAL_BASED_OUTPATIENT_CLINIC_OR_DEPARTMENT_OTHER)
Admission: EM | Admit: 2017-11-29 | Discharge: 2017-11-29 | Disposition: A | Discharge status: Home / Self Care | Attending: Emergency Medicine | Admitting: Emergency Medicine

## 2017-11-29 ENCOUNTER — Encounter (HOSPITAL_BASED_OUTPATIENT_CLINIC_OR_DEPARTMENT_OTHER): Payer: Self-pay | Admitting: *Deleted

## 2017-11-29 ENCOUNTER — Other Ambulatory Visit: Payer: Self-pay

## 2017-11-29 DIAGNOSIS — Z794 Long term (current) use of insulin: Secondary | ICD-10-CM | POA: Insufficient documentation

## 2017-11-29 DIAGNOSIS — E039 Hypothyroidism, unspecified: Secondary | ICD-10-CM | POA: Insufficient documentation

## 2017-11-29 DIAGNOSIS — F17228 Nicotine dependence, chewing tobacco, with other nicotine-induced disorders: Secondary | ICD-10-CM | POA: Insufficient documentation

## 2017-11-29 DIAGNOSIS — Z9641 Presence of insulin pump (external) (internal): Secondary | ICD-10-CM | POA: Insufficient documentation

## 2017-11-29 DIAGNOSIS — M436 Torticollis: Secondary | ICD-10-CM | POA: Diagnosis not present

## 2017-11-29 DIAGNOSIS — E1042 Type 1 diabetes mellitus with diabetic polyneuropathy: Secondary | ICD-10-CM | POA: Insufficient documentation

## 2017-11-29 DIAGNOSIS — Z79899 Other long term (current) drug therapy: Secondary | ICD-10-CM | POA: Insufficient documentation

## 2017-11-29 DIAGNOSIS — M62838 Other muscle spasm: Secondary | ICD-10-CM | POA: Insufficient documentation

## 2017-11-29 DIAGNOSIS — M542 Cervicalgia: Secondary | ICD-10-CM | POA: Diagnosis present

## 2017-11-29 MED ORDER — ACETAMINOPHEN 500 MG PO TABS
1000.0000 mg | ORAL_TABLET | Freq: Once | ORAL | Status: AC
  Administered 2017-11-29: 1000 mg via ORAL
  Filled 2017-11-29: qty 2

## 2017-11-29 MED ORDER — DIAZEPAM 5 MG PO TABS
5.0000 mg | ORAL_TABLET | Freq: Once | ORAL | Status: AC
  Administered 2017-11-29: 5 mg via ORAL
  Filled 2017-11-29: qty 1

## 2017-11-29 MED ORDER — KETOROLAC TROMETHAMINE 15 MG/ML IJ SOLN
15.0000 mg | Freq: Once | INTRAMUSCULAR | Status: AC
  Administered 2017-11-29: 15 mg via INTRAMUSCULAR
  Filled 2017-11-29: qty 1

## 2017-11-29 MED ORDER — DIAZEPAM 5 MG PO TABS: 5.0000 mg | ORAL_TABLET | Freq: Every evening | ORAL | 0 refills | Status: DC | PRN

## 2017-11-29 MED ORDER — OXYCODONE HCL 5 MG PO TABS
5.0000 mg | ORAL_TABLET | Freq: Once | ORAL | Status: AC
  Administered 2017-11-29: 5 mg via ORAL
  Filled 2017-11-29: qty 1

## 2017-11-29 MED FILL — diazePAM 5 MG TABS: 5 | 5 days supply | Qty: 5 | Fill #0

## 2017-11-29 NOTE — ED Notes (Signed)
NA at this time. Pt is stable and leaving with wife.

## 2017-11-29 NOTE — ED Provider Notes (Signed)
[facility] EMERGENCY DEPARTMENT Provider Note   CSN: [id number] Arrival date & time: 11/29/17  4098     History   Chief Complaint Chief Complaint  Patient presents with  . Neck Pain    HPI Connor Holt is a 52 y.o. male.  52 yo M with a chief complaints of left-sided neck pain.  Going on since about 4 this morning.  Patient rolled over in bed and felt a pop to his neck and then had sudden severe pain.  Denies radiation of the pain.  Described as sharp and stabbing.  Worse when he turns his head or looks up and down.  Has significant pain when he moves his head to the left and is limited range of motion in that direction.  Denies numbness or tingling down the arm.  Denies headache or change in vision.  Denies trauma.  The history is provided by the patient.  Neck Pain   This is a new problem. The current episode started 3 to 5 hours ago. The problem occurs constantly. The problem has not changed since onset.The pain is associated with twisting. The pain is present in the left side. The quality of the pain is described as stabbing and shooting. The pain does not radiate. The pain is at a severity of 8/10. The pain is moderate. The symptoms are aggravated by bending and twisting. Pertinent negatives include no chest pain and no headaches. He has tried nothing for the symptoms. The treatment provided no relief.    Past Medical History:  Diagnosis Date  . Anxiety   . Diabetes mellitus   . Dyslipidemia   . Hypothyroidism   . Low back pain     Patient Active Problem List   Diagnosis Date Noted  . Wheezing 02/17/2012  . Lumbar spondylosis 07/05/2011  . Lumbosacral spondylosis without myelopathy 03/21/2011  . INGUINAL PAIN, RIGHT 12/22/2009  . VITAMIN B12 DEFICIENCY 08/06/2009  . VITAMIN D DEFICIENCY 08/06/2009  . SINUSITIS- ACUTE-NOS 08/06/2009  . FATIGUE 08/06/2009  . RASH-NONVESICULAR 08/06/2009  . 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 SKIN LESION  05/13/2009  . COUGH 10/28/2008  . ALLERGIC RHINITIS 03/25/2008  . HYPERCHOLESTEROLEMIA 12/29/2006  . POLYNEUROPATHY 12/29/2006  . HYPOTHYROIDISM 07/29/2006  . DIABETES MELLITUS, TYPE I 07/29/2006  . ANXIETY 07/29/2006    Past Surgical History:  Procedure Laterality Date  . CARDIAC CATHETERIZATION    . HERNIA REPAIR          Home Medications    Prior to Admission medications   Medication Sig Start Date End Date Taking? Authorizing Provider  cholecalciferol (VITAMIN D) 1000 UNITS tablet Take 1,000 Units by mouth daily.   Yes [provider]  DULoxetine (CYMBALTA) 60 MG capsule Take 60 mg by mouth daily.   Yes [provider]  gabapentin (NEURONTIN) 400 MG capsule Take 400 mg by mouth 3 (three) times daily.     Yes [provider]  Insulin Human (INSULIN PUMP) SOLN Inject 0-100 each into the skin continuous. Varies with intake, meals, and exercise.   Yes [provider]  levothyroxine (SYNTHROID, LEVOTHROID) 112 MCG tablet Take 224 mcg by mouth daily before breakfast. Empty stomach   Yes [provider]  ONE TOUCH ULTRA TEST test strip TEST BLOOD SUGAR 8 OR 9 TIMES DAILY AS DIRECTED 06/06/12  Yes Romero Belling, MD  vitamin B-12 (CYANOCOBALAMIN) 100 MCG tablet Take 100 mcg by mouth daily.   Yes [provider]  diazepam (VALIUM) 5 MG tablet Take  1 tablet (5 mg total) by mouth at bedtime as needed for muscle spasms (spasms). 11/29/17   Melene Plan, DO  methocarbamol (ROBAXIN) 500 MG tablet Take 1 tablet (500 mg total) by mouth at bedtime. 02/13/17   Kirsteins, Victorino Sparrow, MD  traMADol (ULTRAM-ER) 100 MG 24 hr tablet Take 1 tablet (100 mg total) by mouth daily. Take twice a day for the month of August then reduce to once a day in September then discontinue in October 08/14/17   Kirsteins, Victorino Sparrow, MD    Family History Family History  Problem Relation Age of Onset  . Diabetes Father     Social History Social History   Tobacco Use  .  Smoking status: Never Smoker  . Smokeless tobacco: Current User    Types: Snuff  Substance Use Topics  . Alcohol use: No  . Drug use: No     Allergies   Patient has no known allergies.   Review of Systems Review of Systems  Constitutional: Negative for chills and fever.  HENT: Negative for congestion and facial swelling.   Eyes: Negative for discharge and visual disturbance.  Respiratory: Negative for shortness of breath.   Cardiovascular: Negative for chest pain and palpitations.  Gastrointestinal: Negative for abdominal pain, diarrhea and vomiting.  Musculoskeletal: Positive for arthralgias, myalgias and neck pain.  Skin: Negative for color change and rash.  Neurological: Negative for tremors, syncope and headaches.  Psychiatric/Behavioral: Negative for confusion and dysphoric mood.     Physical Exam Updated Vital Signs BP (!) 146/109 (BP Location: Left Arm)   Pulse 79   Temp 98.1 F (36.7 C) (Oral)   Resp 18   Ht 6\' 1"  (1.854 m)   Wt 104.3 kg   SpO2 98%   BMI 30.34 kg/m   Physical Exam  Constitutional: He is oriented to person, place, and time. He appears well-developed and well-nourished.  HENT:  Head: Normocephalic and atraumatic.  Eyes: Pupils are equal, round, and reactive to light. EOM are normal.  Neck: Normal range of motion. Neck supple. No JVD present.  Cardiovascular: Normal rate and regular rhythm. Exam reveals no gallop and no friction rub.  No murmur heard. Pulmonary/Chest: No respiratory distress. He has no wheezes.  Abdominal: He exhibits no distension. There is no rebound and no guarding.  Musculoskeletal: Normal range of motion. He exhibits tenderness.  Tenderness and spasm to the left trapezius.  No midline spinal tenderness.  Pain to the paraspinal musculature along the left side.  Pulse motor and sensation is intact to the left upper extremity.  Neurological: He is alert and oriented to person, place, and time.  Skin: No rash noted. No  pallor.  Psychiatric: He has a normal mood and affect. His behavior is normal.  Nursing note and vitals reviewed.    ED Treatments / Results  Labs (all labs ordered are listed, but only abnormal results are displayed) Labs Reviewed - No data to display  EKG None  Radiology No results found.  Procedures Procedures (including critical care time)  Medications Ordered in ED Medications  acetaminophen (TYLENOL) tablet 1,000 mg (1,000 mg Oral Given 11/29/17 0830)  ketorolac (TORADOL) 15 MG/ML injection 15 mg (15 mg Intramuscular Given 11/29/17 0831)  oxyCODONE (Oxy IR/ROXICODONE) immediate release tablet 5 mg (5 mg Oral Given 11/29/17 0830)  diazepam (VALIUM) tablet 5 mg (5 mg Oral Given 11/29/17 0830)     Initial Impression / Assessment and Plan / ED Course  I have reviewed the triage vital signs  and the nursing notes.  Pertinent labs & imaging results that were available during my care of the patient were reviewed by me and considered in my medical decision making (see chart for details).     52 yo M with a chief complaint of left-sided neck pain.  He has no midline spinal tenderness has significant spasm of the trapezius.  Clinically he has torticollis.  He is concerned because he felt a pop.  I discussed with him the unlikely nature of a C-spine fracture without significant trauma.  At this point he will hold off on imaging.  Will treat symptomatically.  No neurologic findings.  PCP follow-up.  8:42 AM:  I have discussed the diagnosis/risks/treatment options with the patient and believe the pt to be eligible for discharge home to follow-up with PCP. We also discussed returning to the ED immediately if new or worsening sx occur. We discussed the sx which are most concerning (e.g., sudden worsening pain, fever, inability to tolerate by mouth) that necessitate immediate return. Medications administered to the patient during their visit and any new prescriptions provided to the  patient are listed below.  Medications given during this visit Medications  acetaminophen (TYLENOL) tablet 1,000 mg (1,000 mg Oral Given 11/29/17 0830)  ketorolac (TORADOL) 15 MG/ML injection 15 mg (15 mg Intramuscular Given 11/29/17 0831)  oxyCODONE (Oxy IR/ROXICODONE) immediate release tablet 5 mg (5 mg Oral Given 11/29/17 0830)  diazepam (VALIUM) tablet 5 mg (5 mg Oral Given 11/29/17 0830)     The patient appears reasonably screen and/or stabilized for discharge and I doubt any other medical condition or other [facility] requiring further screening, evaluation, or treatment in the ED at this time prior to discharge.    Final Clinical Impressions(s) / ED Diagnoses   Final diagnoses:  Torticollis, acute  Trapezius muscle spasm    ED Discharge Orders         Ordered    diazepam (VALIUM) 5 MG tablet  At bedtime PRN     11/29/17 0838           Melene PlanFloyd, Onia Shiflett, DO 11/29/17 [phone]

## 2017-11-29 NOTE — ED Triage Notes (Signed)
Pt woke up in the middle of the night with neck pain to the back,  And left side of  neck pain, he said he heard something pop, cant turn neck to the left side or look down.

## 2017-11-29 NOTE — Discharge Instructions (Signed)
Take 4 over the counter ibuprofen tablets 3 times a day or 2 over-the-counter naproxen tablets twice a day for pain. Also take tylenol 1000mg(2 extra strength) four times a day.    

## 2018-01-18 DIAGNOSIS — E119 Type 2 diabetes mellitus without complications: Secondary | ICD-10-CM | POA: Insufficient documentation

## 2018-01-18 DIAGNOSIS — E559 Vitamin D deficiency, unspecified: Secondary | ICD-10-CM | POA: Insufficient documentation

## 2018-01-18 DIAGNOSIS — E039 Hypothyroidism, unspecified: Secondary | ICD-10-CM | POA: Insufficient documentation

## 2018-01-18 DIAGNOSIS — I10 Essential (primary) hypertension: Secondary | ICD-10-CM | POA: Insufficient documentation

## 2018-01-18 DIAGNOSIS — G629 Polyneuropathy, unspecified: Secondary | ICD-10-CM | POA: Insufficient documentation

## 2018-07-23 ENCOUNTER — Encounter: Attending: Physical Medicine & Rehabilitation | Admitting: Physical Medicine & Rehabilitation

## 2018-07-23 ENCOUNTER — Other Ambulatory Visit: Payer: Self-pay

## 2018-07-23 ENCOUNTER — Encounter: Payer: Self-pay | Admitting: Physical Medicine & Rehabilitation

## 2018-07-23 VITALS — BP 130/92 | HR 98 | Temp 97.9°F | Ht 73.0 in | Wt 238.8 lb

## 2018-07-23 DIAGNOSIS — E1042 Type 1 diabetes mellitus with diabetic polyneuropathy: Secondary | ICD-10-CM | POA: Diagnosis present

## 2018-07-23 NOTE — Progress Notes (Signed)
Subjective:    Patient ID: Connor Holt, male    DOB: 27-Nov-1965, 53 y.o.   MRN: [id number]  HPI 53 yo male with hx of lumbar spondylosis 5 month hx of intermittently severe pain in groin, rectum, posterior thigh.  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 General activity 7 Relation with others 7 Enjoyment of life 6 What TIME of day is your pain at its worst? varies Sleep (in general) Good  Pain is worse with: walking, standing and some activites Pain improves with: rest Relief from Meds: nA  Mobility how many minutes can you walk? none when hurting ability to climb steps?  yes do you drive?  yes  Function disabled: date disabled 05/2017  Neuro/Psych trouble walking  Prior Studies Any changes since last visit?  no  Physicians involved in your care Any changes since last visit?  no   Family History  Problem Relation Age of Onset  . Diabetes Father    Social History   Socioeconomic History  . Marital status: Married    Spouse name: Not on file  . Number of children: Not on file  . Years of education: Not on file  . Highest education level: Not on file  Occupational History  . Not on file  Social Needs  . Financial resource strain: Not on file  . Food insecurity    Worry: Not on file   Inability: Not on file  . Transportation needs    Medical: Not on file    Non-medical: Not on file  Tobacco Use  . Smoking status: Never Smoker  . Smokeless tobacco: Current User    Types: Snuff  Substance and Sexual Activity  . Alcohol use: No  . Drug use: No  . Sexual activity: Not on file  Lifestyle  . Physical activity    Days per week: Not on file    Minutes per session: Not on file  . Stress: Not on file  Relationships  . Social Herbalist on phone: Not on file    Gets together: Not on file    Attends religious service: Not on file    Active member of club or organization: Not on file    Attends meetings of clubs or organizations: Not on file    Relationship status: Not on file  Other Topics Concern  . Not on file  Social History Narrative  . Not on file   Past Surgical History:  Procedure Laterality Date  . CARDIAC CATHETERIZATION    . HERNIA REPAIR     Past Medical History:  Diagnosis Date  . Anxiety   . Diabetes mellitus   . Dyslipidemia   . Hypothyroidism   . Low back pain    BP (!) 130/92   Pulse  98   Temp 97.9 F (36.6 C)   Ht 6\' 1"  (1.854 m)   Wt 238 lb 12.8 oz (108.3 kg)   SpO2 94%   BMI 31.51 kg/m   Opioid Risk Score:   Fall Risk Score:  `1  Depression screen PHQ 2/9  [facility] 03/31/2015 09/30/2014  Decreased Interest 0 0  Down, Depressed, Hopeless 0 0  PHQ - 2 Score 0 0  Altered sleeping 2 -  Tired, decreased energy 1 -  Change in appetite 1 -  Feeling bad or failure about yourself  0 -  Trouble concentrating 0 -  Moving slowly or fidgety/restless 1 -  Suicidal thoughts 0 -  PHQ-9 Score 5 -    Review of Systems  Constitutional: Negative.   HENT: Negative.   Eyes: Negative.   Respiratory: Positive for apnea.   Cardiovascular: Negative.   Gastrointestinal: Negative.   Endocrine: Negative.   Genitourinary: Negative.   Musculoskeletal: Negative.   Skin: Negative.   Allergic/Immunologic: Negative.    Neurological: Negative.   Hematological: Negative.   Psychiatric/Behavioral: Negative.   All other systems reviewed and are negative.      Objective:   Physical Exam Vitals signs and nursing note reviewed.  Constitutional:      Appearance: Normal appearance.  HENT:     Head: Normocephalic and atraumatic.     Nose: Nose normal.  Abdominal:     Hernia: There is no hernia in the left inguinal area or right inguinal area.  Genitourinary:    Pubic Area: No rash.      Penis: No erythema or swelling.      Scrotum/Testes:        Right: Swelling not present.  Musculoskeletal:     Comments: No pain with range of motion of the right hip No pain with range of motion left hip Negative straight leg raising  Lymphadenopathy:     Lower Body: No right inguinal adenopathy. No left inguinal adenopathy.  Skin:    General: Skin is warm and dry.  Neurological:     General: No focal deficit present.     Mental Status: He is alert and oriented to person, place, and time.     Gait: Gait is intact.     Deep Tendon Reflexes:     Reflex Scores:      Patellar reflexes are 1+ on the right side and 1+ on the left side.      Achilles reflexes are 1+ on the right side and 1+ on the left side.    Comments: Patient has no pinprick deficits bilateral L2-L3-L4 L5-S1.  He has no light touch deficits in the perianal area or in the perineal area. Motor strength is 5/5 bilateral hip flexors knee extensors ankle dorsiflexors.   Psychiatric:        Mood and Affect: Mood normal.        Behavior: Behavior normal.           Assessment & Plan:  1.  Right sided perineal pain, no mechanical features, recent negative colonoscopy. The patient is a type I diabetic who has diabetic distal polyneuropathy as well as autonomic neuropathy and carpal tunnel syndrome.  We discussed that given his history of several diabetic neuropathies this is most likely a similar issue.  We discussed that it is less likely that there  is a mass outside of the GI tract in the pelvic area that could be causing compression of the same nerves.  A CT of the pelvis would adequately evaluate that area.  He will discuss this with his primary doctor. The patient does not have any sensory deficits in the perineal area and I have asked him to check whether during his bouts of pain whether he does have numbness in that area. 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 The patient does have history of erectile dysfunction I have asked the patient to increase his gabapentin from 400 mg 3 times daily to 400 mg 4 times daily. If he is still not having adequate relief and he is not experiencing any drowsiness from the gabapentin he may go up to 800 mg 3 times per day.  If this is not helpful he may potentially benefit from S2-S3-S4 sacral blocks on the right side.

## 2018-07-23 NOTE — Patient Instructions (Addendum)
I suspect the symptoms are related to a diabetic neuropathy, probaly in the pudendal  or related nerve   CT of pelvis could show a mass outside of the GI Tract that may not be seen on colonoscopy   Increase gabapentin to 4 times a day or even 2 tablets 3 times a day

## 2018-08-06 ENCOUNTER — Ambulatory Visit: Admitting: Physical Medicine & Rehabilitation

## 2018-08-13 ENCOUNTER — Ambulatory Visit: Admitting: Physical Medicine & Rehabilitation

## 2019-04-05 ENCOUNTER — Other Ambulatory Visit: Payer: Self-pay

## 2019-04-05 ENCOUNTER — Encounter: Attending: Physical Medicine & Rehabilitation | Admitting: Physical Medicine & Rehabilitation

## 2019-04-05 ENCOUNTER — Encounter: Payer: Self-pay | Admitting: Physical Medicine & Rehabilitation

## 2019-04-05 VITALS — BP 148/100 | HR 94 | Temp 97.3°F | Ht 73.0 in | Wt 249.0 lb

## 2019-04-05 DIAGNOSIS — M76891 Other specified enthesopathies of right lower limb, excluding foot: Secondary | ICD-10-CM | POA: Insufficient documentation

## 2019-04-05 NOTE — Patient Instructions (Signed)
Please try to limit external rotation Righ thip when you drive

## 2019-04-05 NOTE — Progress Notes (Signed)
Subjective:    Patient ID: Connor Holt, male    DOB: [birth year], 54 y.o.   MRN: [id number]  HPI  54 year old male with history of chronic low back pain he has a history of lumbar spondylosis No evidence of myelopathy or radiculopathy.  He did respond to radiofrequency procedure but did not have this repeated. The patient also has a history of chronic right hip pain with some radiation into the scrotal area he has been seen by urology as well as general surgery for inguinal hernia.  The patient had MRI of the lumbar spine in 2012 which did not show any compressive lesions to explain his hip and groin pain.  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  He was referred to physical therapy and had improvement.  Patient has not noted any scrotal swelling.  Patient was evaluated at the [facility] in [place].  An MRI of the lumbar spine reportedly demonstrated no new compression.  I do not have access to that report. Right lateral hip and buttocks pain, intermittent accompainied by burning and tingling  Back pain has worsened as well but does not have any pain going below the knees bilaterally. 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  This is intermittent in nature Pain Inventory Average Pain 7 Pain Right Now 5 My pain is sharp, stabbing and aching  In the last 24 hours, has pain interfered with the following? General activity 8 Relation with others 8 Enjoyment of life 8 What TIME of day is your pain at its worst? daytime Sleep (in general) Fair  Pain is worse with: walking and standing Pain improves with: medication Relief from Meds: 3  Mobility walk without assistance  Function disabled: date disabled .  Neuro/Psych No problems in this area  Prior Studies Any changes since last visit?  no  Physicians involved in  your care Any changes since last visit?  no   Family History  Problem Relation Age of Onset  . Diabetes Father    Social History   Socioeconomic History  . Marital status: Married    Spouse name: Not on file  . Number of children: Not on file  . Years of education: Not on file  . Highest education level: Not on file  Occupational History  . Not on file  Tobacco Use  . Smoking status: Never Smoker  . Smokeless tobacco: Current User    Types: Snuff  Substance and Sexual Activity  . Alcohol use: No  . Drug use: No  . Sexual activity: Not on file  Other Topics Concern  . Not on file  Social History Narrative  . Not on file   Social Determinants of Health   Financial Resource Strain:   . Difficulty of Paying Living Expenses:   Food Insecurity:   . Worried About Charity fundraiser in the Last Year:   . Arboriculturist in the Last Year:   Transportation Needs:   . Film/video editor (Medical):   Marland Kitchen Lack of Transportation (Non-Medical):   Physical Activity:   . Days of Exercise per Week:   . Minutes of Exercise per Session:   Stress:   . Feeling of Stress :   Social Connections:   . Frequency of Communication with Friends and Family:   . Frequency of Social Gatherings  with Friends and Family:   . Attends Religious Services:   . Active Member of Clubs or Organizations:   . Attends Banker Meetings:   Marland Kitchen Marital Status:    Past Surgical History:  Procedure Laterality Date  . CARDIAC CATHETERIZATION    . HERNIA REPAIR     Past Medical History:  Diagnosis Date  . Anxiety   . Diabetes mellitus   . Dyslipidemia   . Hypothyroidism   . Low back pain    There were no vitals taken for this visit.  Opioid Risk Score:   Fall Risk Score:  `1  Depression screen PHQ 2/9  Depression screen [facility] 2/9 03/31/2015 09/30/2014  Decreased Interest 0 0  Down, Depressed, Hopeless 0 0  PHQ - 2 Score 0 0  Altered sleeping 2 -  Tired, decreased energy 1 -   Change in appetite 1 -  Feeling bad or failure about yourself  0 -  Trouble concentrating 0 -  Moving slowly or fidgety/restless 1 -  Suicidal thoughts 0 -  PHQ-9 Score 5 -     Review of Systems  Constitutional: Negative.   HENT: Negative.   Eyes: Negative.   Respiratory: Negative.   Cardiovascular: Negative.   Gastrointestinal: Negative.   Endocrine: Negative.   Genitourinary: Negative.   Musculoskeletal: Negative.   Skin: Negative.   Allergic/Immunologic: Negative.   Neurological: Negative.   Hematological: Negative.   Psychiatric/Behavioral: Negative.   All other systems reviewed and are negative.      Objective:   Physical Exam Vitals and nursing note reviewed.  Constitutional:      Appearance: Normal appearance.  Eyes:     Extraocular Movements: Extraocular movements intact.     Conjunctiva/sclera: Conjunctivae normal.     Pupils: Pupils are equal, round, and reactive to light.  Genitourinary:    Penis: Normal.      Testes: Normal.     Comments: No inguinal masses palpated no scrotal swelling Musculoskeletal:     Comments: Pain with hip external rotation there is some pain in the buttocks with hip internal rotation particularly with some resistance Hip range of motion is normal. Lumbar spine has mildly limited range of motion no pain Negative straight leg raising  Skin:    General: Skin is warm and dry.  Neurological:     General: No focal deficit present.     Mental Status: He is alert and oriented to person, place, and time.     Gait: Gait is intact.     Deep Tendon Reflexes:     Reflex Scores:      Patellar reflexes are 2+ on the right side and 2+ on the left side.      Achilles reflexes are 2+ on the right side and 2+ on the left side.    Comments: Normal sensation to pinprick in L1-L2 L3-L4-laterally  Psychiatric:        Mood and Affect: Mood normal.        Behavior: Behavior normal.           Assessment & Plan:  #1.  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  Will refer to physical therapy.  I will see him back in 6 weeks if no improvement would consider repeat MRI of the hip. I would like to see the MRI of the lumbar spine week.  Get this from the [place]

## 2019-04-16 DIAGNOSIS — M25549 Pain in joints of unspecified hand: Secondary | ICD-10-CM | POA: Insufficient documentation

## 2019-04-16 DIAGNOSIS — M7711 Lateral epicondylitis, right elbow: Secondary | ICD-10-CM | POA: Insufficient documentation

## 2019-04-16 DIAGNOSIS — M25521 Pain in right elbow: Secondary | ICD-10-CM | POA: Insufficient documentation

## 2019-04-26 ENCOUNTER — Ambulatory Visit

## 2019-04-29 ENCOUNTER — Other Ambulatory Visit: Payer: Self-pay

## 2019-04-29 ENCOUNTER — Ambulatory Visit: Attending: Physical Medicine & Rehabilitation

## 2019-04-29 DIAGNOSIS — M5441 Lumbago with sciatica, right side: Secondary | ICD-10-CM | POA: Insufficient documentation

## 2019-04-29 DIAGNOSIS — M25551 Pain in right hip: Secondary | ICD-10-CM | POA: Diagnosis present

## 2019-04-29 DIAGNOSIS — G8929 Other chronic pain: Secondary | ICD-10-CM | POA: Insufficient documentation

## 2019-04-29 NOTE — Therapy (Signed)
[facility] [address] [place], [place], [address] Phone: [phone]   Fax:  [phone]  Physical Therapy Evaluation  Patient Details  Name: Connor Holt MRN: [id number] Date of Birth: 08-04-1965 Referring Provider (PT): Dr. Wynn Banker   Encounter Date: 04/29/2019  PT End of Session - 04/29/19 1714    Visit Number  1    Number of Visits  9    Date for PT Re-Evaluation  05/27/19    PT Start Time  1615    PT Stop Time  1700    PT Time Calculation (min)  45 min    Activity Tolerance  Patient tolerated treatment well    Behavior During Therapy  [facility] for tasks assessed/performed       Past Medical History:  Diagnosis Date  . Anxiety   . Diabetes mellitus   . Dyslipidemia   . Hypothyroidism   . Low back pain     Past Surgical History:  Procedure Laterality Date  . CARDIAC CATHETERIZATION    . HERNIA REPAIR      There were no vitals filed for this visit.   Subjective Assessment - 04/29/19 1701    Subjective  Pt reports of chronic hx of low back pain and R hip pain. Pt reports he had radiofrequency surgeris on back which have helped. Back pain is getting worst lately. Last sx was >3 yrs ago. Pt reports R hip pain has been intermittent over the years and worst in last 6 months or so. Pain is intermittent and not related to any specific activities or positions. When triggered, it can last for few minutes to few days. When trigger, he reports of difficulty putting weight on it.    Pertinent History  Chronic LBP, DM    How long can you sit comfortably?  1 hours    How long can you stand comfortably?  30 min    How long can you walk comfortably?  30 min    Diagnostic tests  03/15/2010:  IMPRESSION: 1.  Moderate facet arthritis at L5 S1.  Small annular tear at L5S1, unchanged since the prior exam, based on the prior report.2.  Mild right facet arthritis at L4-5.3.  Specifically, the L1-2 disc is normal. 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    Patient Stated Goals  improve back pain and hip pain    Currently in Pain?  Yes    Pain Score  5     Pain Location  Back    Pain Orientation  Right;Left    Pain Descriptors / Indicators  Aching;Discomfort;Tightness;Tiring    Pain Type  Chronic pain    Pain Radiating Towards  R groin, R lateral hip, R lateral thigh, R gluts    Pain Onset  More than a month ago    Pain Frequency  Intermittent    Aggravating Factors   unidentified    Pain Relieving Factors  position change, rest         [facility] PT Assessment - 04/29/19 1705      Assessment   Medical Diagnosis  chronic LBP, R hip pain    Referring Provider (PT)  Dr. Wynn Banker    Onset Date/Surgical Date  04/05/19      Precautions   Precautions  None      Restrictions   Weight Bearing Restrictions  No  ROM / Strength   AROM / PROM / Strength  Strength      PROM   PROM Assessment Site  Hip    Right/Left Hip  Right;Left    Right Hip Flexion  125    Right Hip External Rotation   45    Right Hip Internal Rotation   20    Left Hip Flexion  125    Left Hip External Rotation   45    Left Hip Internal Rotation   20      Strength   Strength Assessment Site  Knee;Hip;Ankle    Right/Left Hip  Right;Left    Right Hip Flexion  5/5    Right Hip Extension  5/5    Right Hip ABduction  5/5    Left Hip Flexion  5/5    Left Hip Extension  5/5    Left Hip ABduction  5/5    Right/Left Knee  Right;Left    Right Knee Flexion  5/5    Right Knee Extension  5/5    Left Knee Flexion  5/5    Left Knee Extension  5/5    Right/Left Ankle  Right;Left    Right Ankle Dorsiflexion  5/5    Right Ankle Plantar Flexion  5/5    Left Ankle Dorsiflexion  5/5    Left Ankle Plantar Flexion  5/5      Special Tests    Special Tests  Hip Special Tests    Hip Special  Tests   Saralyn Pilar (FABER) Test;SI Compression;Hip Scouring      Saralyn Pilar (FABER) Test   Findings  Negative    Side  Right      SI Compression   Findings  Negative    Side  Right      Hip Scouring   Findings  Negative    Side  Right                Objective measurements completed on examination: See above findings.     Manual therapy: Grade II-IV lumbar PA mobilization L1-2, L4-5  TherEx: Press ups: 10x SL hip adduction: 3 x 10 Cat and camel: 15x Piriformis stretch: in supine and at EOB: 3 x 30" R and L        PT Education - 04/29/19 1709    Education Details  Pt educated on importance of compliance with HEP    Person(s) Educated  Patient    Methods  Explanation    Comprehension  Verbalized understanding       PT Short Term Goals - 04/29/19 1709      PT SHORT TERM GOAL #1   Title  Pt will report of pain <6/10 in R hip when triggered to improve ability to walk    Baseline  pain can be 9/10 when triggered, pt unable to put weight through R LE when pain is triggered    Time  2    Period  Weeks    Status  New    Target Date  05/13/19      PT SHORT TERM GOAL #2   Title  Pt will be able to perform yard work for 1 hour with pain in back/hip <5/10 to improve function    Baseline  7-8/10    Time  2    Period  Weeks    Status  New    Target Date  05/13/19        PT Long Term Goals - 04/29/19 1711  PT LONG TERM GOAL #1   Title  Pt will report R hip pain trigger only 1x/month to improve overall function    Baseline  2-4x/month    Time  4    Period  Weeks    Status  New    Target Date  05/27/19      PT LONG TERM GOAL #2   Title  Pt will report of back pain/r hip pain <5/10 at worst to improve overall function    Baseline  8-9/10 at worst    Time  4    Period  Weeks    Status  New    Target Date  05/27/19             Plan - 04/29/19 1646    Clinical Impression Statement  Patient is a 54 y.o. male who was seen today for physical  therapy evaluation and treatment for chronic low back pain and intermittent R hip. patient's lumbar ROM is limited into extension and bil lateral flexion. Patient has 5/5 strength grossly in bil LE. Patient has WFL of AROM in bil hips. Patient demonstrates no neural tension in legs. chronic LBP and intermittent R hip pain is limiting patient from prolonged sitting, standing, walking, transfers, driving, and functional activities. patient will benefit from skilled PT to address his ROM and pain impairments to improve overall function.    Personal Factors and Comorbidities  Comorbidity 2    Comorbidities  DM, chronic back pain    Examination-Activity Limitations  Locomotion Level;Stairs;Stand    Examination-Participation Restrictions  Cleaning;Community Activity;Driving;Yard Work    Conservation officer, historic buildings  Evolving/Moderate complexity    Clinical Decision Making  Moderate    Rehab Potential  Good    PT Frequency  2x / week    PT Duration  6 weeks    PT Treatment/Interventions  ADLs/Self Care Home Management;Electrical Stimulation;Gait training;Stair training;Therapeutic exercise;Balance training;Neuromuscular re-education;Joint Manipulations    PT Next Visit Plan  Reassess    PT Home Exercise Plan  Access Code: [email or web address]: [email or web address]: 04/19/2021Prepared by: Theone Murdoch PatelExercisesProne Press Up - 2 x daily - 7 x weekly - 1 sets - 10 repsCat-Camel - 2 x daily - 7 x weekly - 1 sets - 10 repsSidelying Hip Adduction - 2 x daily - 7 x weekly - 3 sets - 10 repsSeated Table Piriformis Stretch - 2 x daily - 7 x weekly - 1 sets - 3 reps - 30 hold       Patient will benefit from skilled therapeutic intervention in order to improve the following deficits and impairments:  Pain, Hypomobility  Visit Diagnosis: Chronic bilateral low back pain with right-sided sciatica  Pain in right hip     Problem List Patient Active Problem List   Diagnosis Date Noted  .  Wheezing 02/17/2012  . Lumbar spondylosis 07/05/2011  . Lumbosacral spondylosis without myelopathy 03/21/2011  . INGUINAL PAIN, RIGHT 12/22/2009  . VITAMIN B12 DEFICIENCY 08/06/2009  . VITAMIN D DEFICIENCY 08/06/2009  . SINUSITIS- ACUTE-NOS 08/06/2009  . FATIGUE 08/06/2009  . RASH-NONVESICULAR 08/06/2009  . 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 SKIN LESION 05/13/2009  . COUGH 10/28/2008  . ALLERGIC RHINITIS 03/25/2008  . HYPERCHOLESTEROLEMIA 12/29/2006  . POLYNEUROPATHY 12/29/2006  . HYPOTHYROIDISM 07/29/2006  . DIABETES MELLITUS, TYPE I 07/29/2006  . ANXIETY 07/29/2006    Ileana Ladd 04/29/2019, 5:15 PM  [facility] [address] [place], [place], [address] Phone: [phone]   Fax:  [phone]  Name:  ERON STAAT MRN: [id number] Date of Birth: [birth year]

## 2019-05-03 ENCOUNTER — Telehealth: Payer: Self-pay | Admitting: *Deleted

## 2019-05-03 MED ORDER — TRAMADOL HCL 50 MG PO TABS: 50.0000 mg | ORAL_TABLET | Freq: Two times a day (BID) | ORAL | 0 refills | Status: DC

## 2019-05-03 NOTE — Telephone Encounter (Signed)
Mr Glomski has not had pain medication since Dr Wynn Banker last prescribed. Per Dr Wynn Banker I will call in the initial 5 day supply for acute pain 50 mg tramadol 1 po bid#10 and he can call back if needed after that for the additional week refill to get to his 05/17/19 appt with Dr Wynn Banker. Mr Mentzer notified.

## 2019-05-06 ENCOUNTER — Ambulatory Visit

## 2019-05-06 ENCOUNTER — Other Ambulatory Visit: Payer: Self-pay

## 2019-05-06 DIAGNOSIS — M5441 Lumbago with sciatica, right side: Secondary | ICD-10-CM | POA: Diagnosis not present

## 2019-05-06 DIAGNOSIS — G8929 Other chronic pain: Secondary | ICD-10-CM

## 2019-05-06 DIAGNOSIS — M25551 Pain in right hip: Secondary | ICD-10-CM

## 2019-05-06 NOTE — Therapy (Signed)
[facility] [address] [place], [place], [address] Phone: [phone]   Fax:  [phone]  Physical Therapy Treatment  Patient Details  Name: Connor Holt MRN: [id number] Date of Birth: 07-07-1965 Referring Provider (PT): Dr. Wynn Banker   Encounter Date: 05/06/2019    Past Medical History:  Diagnosis Date  . Anxiety   . Diabetes mellitus   . Dyslipidemia   . Hypothyroidism   . Low back pain     Past Surgical History:  Procedure Laterality Date  . CARDIAC CATHETERIZATION    . HERNIA REPAIR      There were no vitals filed for this visit.  Subjective Assessment - 05/06/19 1350    Subjective  He felt good for the rest of the day after last session but then pain was very intense for next few days where he had hard time with walking. He had to call MD and get some Tramadol to get by. Right now pain is better about 3/10 in R groin.    Pertinent History  Chronic LBP, DM    Diagnostic tests  03/15/2010:  IMPRESSION: 1.  Moderate facet arthritis at L5 S1.  Small annular tear at L5S1, unchanged since the prior exam, based on the prior report.2.  Mild right facet arthritis at L4-5.3.  Specifically, the L1-2 disc is normal. 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    Patient Stated Goals  improve back pain and hip pain    Currently in Pain?  Yes    Pain Score  3     Pain Location  Groin    Pain Orientation  Right    Pain Descriptors / Indicators  Aching;Tightness;Stabbing    Pain Type  Chronic pain    Pain Radiating Towards  R groin, R buttocks    Pain Onset  More than a month ago    Pain Frequency  Intermittent                  Treatment  Manual therapy: Unilateral Grade II-IV PA mobilization at L2 Central grade II-IV PA mobilization in mid thoracic  spine Soft tissue mobilization to R piriformis, R glut med, R glut max, R proximal hamstrings  TherEx: Prone hip extensions: 2 x 10 R and L Prone SLR: 2 x 10 R and L     Access Code: [email or web address] URL: [email or web address] Date: 05/06/2019 Prepared by: Lavone Nian  Exercises Cat-Camel - 2 x daily - 7 x weekly - 1 sets - 10 reps Sidelying Hip Adduction - 2 x daily - 7 x weekly - 3 sets - 10 reps Seated Table Piriformis Stretch - 2 x daily - 7 x weekly - 1 sets - 3 reps - 30 hold Prone Hip Extension - 1 x daily - 7 x weekly - 3 sets - 10 reps Active Straight Leg Raise with Quad Set - 1 x daily - 7 x weekly - 3 sets - 10 reps           PT Education - 05/06/19 1353    Education Details  Pt educated and demonstrated on neutral spine posture when sitting, trying to maintain WB through ischial tuberosities and not slouching through back when sitting supported    Person(s) Educated  Patient    Methods  Explanation;Demonstration    Comprehension  Verbalized understanding;Returned demonstration  PT Short Term Goals - 04/29/19 1709      PT SHORT TERM GOAL #1   Title  Pt will report of pain <6/10 in R hip when triggered to improve ability to walk    Baseline  pain can be 9/10 when triggered, pt unable to put weight through R LE when pain is triggered    Time  2    Period  Weeks    Status  New    Target Date  05/13/19      PT SHORT TERM GOAL #2   Title  Pt will be able to perform yard work for 1 hour with pain in back/hip <5/10 to improve function    Baseline  7-8/10    Time  2    Period  Weeks    Status  New    Target Date  05/13/19        PT Long Term Goals - 04/29/19 1711      PT LONG TERM GOAL #1   Title  Pt will report R hip pain trigger only 1x/month to improve overall function    Baseline  2-4x/month    Time  4    Period  Weeks    Status  New    Target Date  05/27/19      PT LONG TERM GOAL #2   Title  Pt will report of back pain/r hip  pain <5/10 at worst to improve overall function    Baseline  8-9/10 at worst    Time  4    Period  Weeks    Status  New    Target Date  05/27/19            Plan - 05/06/19 1353    Clinical Impression Statement  Pt reported gradual improvement in pain and tightness throughout the session. patient didn't benefit from extension exercises. We will focus on neutral spine stability and mobility    Personal Factors and Comorbidities  Comorbidity 2    Comorbidities  DM, chronic back pain    Examination-Activity Limitations  Locomotion Level;Stairs;Stand    Examination-Participation Restrictions  Cleaning;Community Activity;Driving;Yard Work    Conservation officer, historic buildings  Evolving/Moderate complexity    Clinical Decision Making  Moderate    Rehab Potential  Good    PT Frequency  2x / week    PT Duration  6 weeks    PT Treatment/Interventions  ADLs/Self Care Home Management;Electrical Stimulation;Gait training;Stair training;Therapeutic exercise;Balance training;Neuromuscular re-education;Joint Manipulations    PT Next Visit Plan  Reassess    PT Home Exercise Plan  Access Code: [email or web address]: [email or web address]: 04/19/2021Prepared by: Theone Murdoch PatelExercisesProne Press Up - 2 x daily - 7 x weekly - 1 sets - 10 repsCat-Camel - 2 x daily - 7 x weekly - 1 sets - 10 repsSidelying Hip Adduction - 2 x daily - 7 x weekly - 3 sets - 10 repsSeated Table Piriformis Stretch - 2 x daily - 7 x weekly - 1 sets - 3 reps - 30 hold       Patient will benefit from skilled therapeutic intervention in order to improve the following deficits and impairments:  Pain, Hypomobility  Visit Diagnosis: Chronic bilateral low back pain with right-sided sciatica  Pain in right hip     Problem List Patient Active Problem List   Diagnosis Date Noted  . Wheezing 02/17/2012  . Lumbar spondylosis 07/05/2011  . Lumbosacral spondylosis without myelopathy 03/21/2011  . INGUINAL PAIN, RIGHT  12/22/2009  . VITAMIN B12  DEFICIENCY 08/06/2009  . VITAMIN D DEFICIENCY 08/06/2009  . SINUSITIS- ACUTE-NOS 08/06/2009  . FATIGUE 08/06/2009  . RASH-NONVESICULAR 08/06/2009  . 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 SKIN LESION 05/13/2009  . COUGH 10/28/2008  . ALLERGIC RHINITIS 03/25/2008  . HYPERCHOLESTEROLEMIA 12/29/2006  . POLYNEUROPATHY 12/29/2006  . HYPOTHYROIDISM 07/29/2006  . DIABETES MELLITUS, TYPE I 07/29/2006  . ANXIETY 07/29/2006    Kerrie Pleasure 05/06/2019, 1:56 PM  [facility] [address] [place], [place], [address] Phone: [phone]   Fax:  [phone]  Name: JOURDEN GILSON MRN: [id number] Date of Birth: 1965/02/17

## 2019-05-10 ENCOUNTER — Ambulatory Visit

## 2019-05-10 ENCOUNTER — Other Ambulatory Visit: Payer: Self-pay

## 2019-05-10 DIAGNOSIS — M25551 Pain in right hip: Secondary | ICD-10-CM

## 2019-05-10 DIAGNOSIS — M5441 Lumbago with sciatica, right side: Secondary | ICD-10-CM | POA: Diagnosis not present

## 2019-05-10 DIAGNOSIS — G8929 Other chronic pain: Secondary | ICD-10-CM

## 2019-05-10 NOTE — Therapy (Signed)
[facility] [address] [place], [place], [address] Phone: [phone]   Fax:  [phone]  Physical Therapy Treatment  Patient Details  Name: Connor Holt MRN: [id number] Date of Birth: December 06, 1965 Referring Provider (PT): Dr. Letta Pate   Encounter Date: 05/10/2019    Past Medical History:  Diagnosis Date  . Anxiety   . Diabetes mellitus   . Dyslipidemia   . Hypothyroidism   . Low back pain     Past Surgical History:  Procedure Laterality Date  . CARDIAC CATHETERIZATION    . HERNIA REPAIR      There were no vitals filed for this visit.  Subjective Assessment - 05/10/19 1257    Subjective  I am doing much better since last session. I haven't had much pain at all in the hip or back. This morning I woke up with some stiffness in back.    How long can you sit comfortably?  1 hours    How long can you stand comfortably?  30 min    How long can you walk comfortably?  30 min    Diagnostic tests  03/15/2010:  IMPRESSION: 1.  Moderate facet arthritis at L5 S1.  Small annular tear at L5S1, unchanged since the prior exam, based on the prior report.2.  Mild right facet arthritis at L4-5.3.  Specifically, the L1-2 disc is normal. 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    Patient Stated Goals  improve back pain and hip pain    Currently in Pain?  No/denies    Pain Onset  More than a month ago                 Treatment: Soft tissue mobilization  to bil lumbar paraspinalis, quadratus lumborum R glut med, R piriformis Grade IV unilateral Posterior to anterior mobilization L5-S1  Prone hip extensions: 3 x 15 R and L Prone modified plank: 3 x 20" SL modified plank with legs sperate: 3 x 10" R and L P[place]on stretch; 30"  Access Code: [email or web address] URL:  [email or web address] Date: 05/06/2019 Prepared by: Markus Jarvis  Exercises Cat-Camel - 2 x daily - 7 x weekly - 1 sets - 10 reps Sidelying Hip Adduction - 2 x daily - 7 x weekly - 3 sets - 10 reps Seated Table Piriformis Stretch - 2 x daily - 7 x weekly - 1 sets - 3 reps - 30 hold Prone Hip Extension - 1 x daily - 7 x weekly - 3 sets - 10 reps Active Straight Leg Raise with Quad Set - 1 x daily - 7 x weekly - 3 sets - 10 reps  Access Code: [email or web address] URL: [email or web address] Date: 05/10/2019 Prepared by: Markus Jarvis  Exercises Side Plank on Knees - 1 x daily - 7 x weekly - 2 sets - 3 reps - 20 hold Plank on Knees - 1 x daily - 7 x weekly - 2 sets - 3 reps - 20 hold Supine Bridge - 1 x daily - 7 x weekly - 10 reps - 2 sets                 PT Short Term Goals - 04/29/19 1709      PT SHORT TERM GOAL #1   Title  Pt will report of pain <6/10 in R hip when triggered to improve ability to walk  Baseline  pain can be 9/10 when triggered, pt unable to put weight through R LE when pain is triggered    Time  2    Period  Weeks    Status  New    Target Date  05/13/19      PT SHORT TERM GOAL #2   Title  Pt will be able to perform yard work for 1 hour with pain in back/hip <5/10 to improve function    Baseline  7-8/10    Time  2    Period  Weeks    Status  New    Target Date  05/13/19        PT Long Term Goals - 04/29/19 1711      PT LONG TERM GOAL #1   Title  Pt will report R hip pain trigger only 1x/month to improve overall function    Baseline  2-4x/month    Time  4    Period  Weeks    Status  New    Target Date  05/27/19      PT LONG TERM GOAL #2   Title  Pt will report of back pain/r hip pain <5/10 at worst to improve overall function    Baseline  8-9/10 at worst    Time  4    Period  Weeks    Status  New    Target Date  05/27/19              Patient will benefit from skilled therapeutic intervention in order  to improve the following deficits and impairments:     Visit Diagnosis: Chronic bilateral low back pain with right-sided sciatica  Pain in right hip     Problem List Patient Active Problem List   Diagnosis Date Noted  . Wheezing 02/17/2012  . Lumbar spondylosis 07/05/2011  . Lumbosacral spondylosis without myelopathy 03/21/2011  . INGUINAL PAIN, RIGHT 12/22/2009  . VITAMIN B12 DEFICIENCY 08/06/2009  . VITAMIN D DEFICIENCY 08/06/2009  . SINUSITIS- ACUTE-NOS 08/06/2009  . FATIGUE 08/06/2009  . RASH-NONVESICULAR 08/06/2009  . 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 SKIN LESION 05/13/2009  . COUGH 10/28/2008  . ALLERGIC RHINITIS 03/25/2008  . HYPERCHOLESTEROLEMIA 12/29/2006  . POLYNEUROPATHY 12/29/2006  . HYPOTHYROIDISM 07/29/2006  . DIABETES MELLITUS, TYPE I 07/29/2006  . ANXIETY 07/29/2006    Ileana Ladd 05/10/2019, 12:57 PM  [facility] [address] [place], [place], [address] Phone: [phone]   Fax:  [phone]  Name: Connor Holt MRN: [id number] Date of Birth: [birth year]

## 2019-05-13 ENCOUNTER — Other Ambulatory Visit: Payer: Self-pay

## 2019-05-13 ENCOUNTER — Ambulatory Visit: Attending: Physical Medicine & Rehabilitation

## 2019-05-13 DIAGNOSIS — G8929 Other chronic pain: Secondary | ICD-10-CM | POA: Insufficient documentation

## 2019-05-13 DIAGNOSIS — M25551 Pain in right hip: Secondary | ICD-10-CM | POA: Insufficient documentation

## 2019-05-13 DIAGNOSIS — M47816 Spondylosis without myelopathy or radiculopathy, lumbar region: Secondary | ICD-10-CM | POA: Diagnosis not present

## 2019-05-13 DIAGNOSIS — M5441 Lumbago with sciatica, right side: Secondary | ICD-10-CM | POA: Diagnosis present

## 2019-05-13 NOTE — Therapy (Signed)
Connor Holt [address] [place], [place], [address] Phone: [phone]   Fax:  [phone]  Physical Therapy Treatment  Patient Details  Name: Connor Holt MRN: [id number] Date of Birth: 11-16-1965 Referring Provider (PT): Dr. Letta Pate   Encounter Date: 05/13/2019  PT End of Session - 05/13/19 1145    Visit Number  4    Number of Visits  9    Date for PT Re-Evaluation  05/27/19    PT Start Time  1115    PT Stop Time  1145    PT Time Calculation (min)  30 min    Activity Tolerance  Patient tolerated treatment well    Behavior During Therapy  [facility] for tasks assessed/performed       Past Medical History:  Diagnosis Date  . Anxiety   . Diabetes mellitus   . Dyslipidemia   . Hypothyroidism   . Low back pain     Past Surgical History:  Procedure Laterality Date  . CARDIAC CATHETERIZATION    . HERNIA REPAIR      There were no vitals filed for this visit.  Subjective Assessment - 05/13/19 1138    Subjective  I had a twinge of R groin pain over the weekend but it was gone very quickly. I am starting to notice some back pain on left side.    Pertinent History  Chronic LBP, DM    How long can you sit comfortably?  1 hours    How long can you stand comfortably?  30 min    How long can you walk comfortably?  30 min    Diagnostic tests  03/15/2010:  IMPRESSION: 1.  Moderate facet arthritis at L5 S1.  Small annular tear at L5S1, unchanged since the prior exam, based on the prior report.2.  Mild right facet arthritis at L4-5.3.  Specifically, the L1-2 disc is normal. 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    Patient Stated Goals  improve back pain and hip pain    Pain Onset  More than a month ago           Treatment: STM to bil lumbar paraspinalis Grade IV  unilateral and central PA mobilization at L4-5 Muscle energy technique with L hip IR and L hip flexion to open up L L4-5   Educated on demonstrated on sleeping on side with neutral lumbar spine.   Supine deadbugs: heel taps at 3 different positions: 3 x 20 added to HEP  Access Code: [email or web address] URL: [email or web address] Date: 05/13/2019 Prepared by: Markus Jarvis  Exercises Dead Bug - 1 x daily - 7 x weekly - 1 sets - 20 reps                     PT Short Term Goals - 04/29/19 1709      PT SHORT TERM GOAL #1   Title  Pt will report of pain <6/10 in R hip when triggered to improve ability to walk    Baseline  pain can be 9/10 when triggered, pt unable to put weight through R LE when pain is triggered    Time  2    Period  Weeks    Status  New    Target Date  05/13/19      PT SHORT TERM GOAL #2   Title  Pt will  be able to perform yard work for 1 hour with pain in back/hip <5/10 to improve function    Baseline  7-8/10    Time  2    Period  Weeks    Status  New    Target Date  05/13/19        PT Long Term Goals - 04/29/19 1711      PT LONG TERM GOAL #1   Title  Pt will report R hip pain trigger only 1x/month to improve overall function    Baseline  2-4x/month    Time  4    Period  Weeks    Status  New    Target Date  05/27/19      PT LONG TERM GOAL #2   Title  Pt will report of back pain/r hip pain <5/10 at worst to improve overall function    Baseline  8-9/10 at worst    Time  4    Period  Weeks    Status  New    Target Date  05/27/19            Plan - 05/13/19 1145    Clinical Impression Statement  Improved pain, pt reported no pain with lumbar AROM. Pt is reporting decreased bouts of groin pain and when they happened they last for short duration    Personal Factors and Comorbidities  Comorbidity 2    Comorbidities  DM, chronic back pain    Examination-Activity Limitations  Locomotion Level;Stairs;Stand     Examination-Participation Restrictions  Cleaning;Community Activity;Driving;Yard Work    Conservation officer, historic buildings  Evolving/Moderate complexity    Rehab Potential  Good    PT Frequency  2x / week    PT Duration  6 weeks    PT Treatment/Interventions  ADLs/Self Care Home Management;Electrical Stimulation;Gait training;Stair training;Therapeutic exercise;Balance training;Neuromuscular re-education;Joint Manipulations    PT Next Visit Plan  Reassess    PT Home Exercise Plan  Access Code: [email or web address]: [email or web address]: 04/19/2021Prepared by: Theone Murdoch PatelExercisesProne Press Up - 2 x daily - 7 x weekly - 1 sets - 10 repsCat-Camel - 2 x daily - 7 x weekly - 1 sets - 10 repsSidelying Hip Adduction - 2 x daily - 7 x weekly - 3 sets - 10 repsSeated Table Piriformis Stretch - 2 x daily - 7 x weekly - 1 sets - 3 reps - 30 hold       Patient will benefit from skilled therapeutic intervention in order to improve the following deficits and impairments:  Pain, Hypomobility  Visit Diagnosis: Lumbar spondylosis     Problem List Patient Active Problem List   Diagnosis Date Noted  . Wheezing 02/17/2012  . Lumbar spondylosis 07/05/2011  . Lumbosacral spondylosis without myelopathy 03/21/2011  . INGUINAL PAIN, RIGHT 12/22/2009  . VITAMIN B12 DEFICIENCY 08/06/2009  . VITAMIN D DEFICIENCY 08/06/2009  . SINUSITIS- ACUTE-NOS 08/06/2009  . FATIGUE 08/06/2009  . RASH-NONVESICULAR 08/06/2009  . 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 SKIN LESION 05/13/2009  . COUGH 10/28/2008  . ALLERGIC RHINITIS 03/25/2008  . HYPERCHOLESTEROLEMIA 12/29/2006  . POLYNEUROPATHY 12/29/2006  . HYPOTHYROIDISM 07/29/2006  . DIABETES MELLITUS, TYPE I 07/29/2006  . ANXIETY 07/29/2006    Connor Holt, PT 05/13/2019, 11:46 AM  [facility] [address] [place], [place], [address] Phone: [phone]   Fax:   [phone]  Name: CORDON Holt MRN: [id number] Date of Birth: August 03, 1965

## 2019-05-15 ENCOUNTER — Other Ambulatory Visit: Payer: Self-pay | Admitting: *Deleted

## 2019-05-15 MED ORDER — TRAMADOL HCL 50 MG PO TABS: 50.0000 mg | ORAL_TABLET | Freq: Two times a day (BID) | ORAL | 1 refills | Status: DC

## 2019-05-16 ENCOUNTER — Ambulatory Visit

## 2019-05-17 ENCOUNTER — Encounter: Admitting: Physical Medicine & Rehabilitation

## 2019-05-20 ENCOUNTER — Ambulatory Visit

## 2019-05-20 ENCOUNTER — Other Ambulatory Visit: Payer: Self-pay

## 2019-05-20 DIAGNOSIS — M5441 Lumbago with sciatica, right side: Secondary | ICD-10-CM

## 2019-05-20 DIAGNOSIS — M47816 Spondylosis without myelopathy or radiculopathy, lumbar region: Secondary | ICD-10-CM | POA: Diagnosis not present

## 2019-05-20 DIAGNOSIS — M25551 Pain in right hip: Secondary | ICD-10-CM

## 2019-05-20 NOTE — Therapy (Signed)
[facility] [address] [place], [place], [address] Phone: [phone]   Fax:  [phone]  Physical Therapy Treatment  Patient Details  Name: Connor Holt MRN: [id number] Date of Birth: 1965/01/16 Referring Provider (PT): Dr. Letta Pate   Encounter Date: 05/20/2019    Past Medical History:  Diagnosis Date  . Anxiety   . Diabetes mellitus   . Dyslipidemia   . Hypothyroidism   . Low back pain     Past Surgical History:  Procedure Laterality Date  . CARDIAC CATHETERIZATION    . HERNIA REPAIR      There were no vitals filed for this visit.  Subjective Assessment - 05/20/19 1542    Subjective  Pt reports he is trying to put the flooring down in one of his room. He is having 6/10 pain in R groin, R posterior hip and going down posterior leg into the calf.    Pertinent History  Chronic LBP, DM    How long can you sit comfortably?  1 hours    How long can you stand comfortably?  30 min    Diagnostic tests  03/15/2010:  IMPRESSION: 1.  Moderate facet arthritis at L5 S1.  Small annular tear at L5S1, unchanged since the prior exam, based on the prior report.2.  Mild right facet arthritis at L4-5.3.  Specifically, the L1-2 disc is normal. 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    Patient Stated Goals  improve back pain and hip pain    Currently in Pain?  Yes    Pain Score  6     Pain Location  Hip    Pain Orientation  Right    Pain Descriptors / Indicators  Aching;Tightness;Stabbing;Radiating    Pain Type  Chronic pain    Pain Radiating Towards  R groin, R buttocks, R posterior leg into calf    Pain Onset  More than a month ago    Pain Frequency  Intermittent            Manual therapy Grade IV unilateral and central PA mobilization at L4-5, S1 STM to bil  lumbar paraspinalis STM To L hip flexors, TFL, lateral quads STM to L glut med, piriformis, hamstrings Muscle energy technique with prone manual resistance hip internal rotation                     PT Short Term Goals - 04/29/19 1709      PT SHORT TERM GOAL #1   Title  Pt will report of pain <6/10 in R hip when triggered to improve ability to walk    Baseline  pain can be 9/10 when triggered, pt unable to put weight through R LE when pain is triggered    Time  2    Period  Weeks    Status  New    Target Date  05/13/19      PT SHORT TERM GOAL #2   Title  Pt will be able to perform yard work for 1 hour with pain in back/hip <5/10 to improve function    Baseline  7-8/10    Time  2    Period  Weeks    Status  New    Target Date  05/13/19        PT Long Term Goals - 04/29/19 1711  PT LONG TERM GOAL #1   Title  Pt will report R hip pain trigger only 1x/month to improve overall function    Baseline  2-4x/month    Time  4    Period  Weeks    Status  New    Target Date  05/27/19      PT LONG TERM GOAL #2   Title  Pt will report of back pain/r hip pain <5/10 at worst to improve overall function    Baseline  8-9/10 at worst    Time  4    Period  Weeks    Status  New    Target Date  05/27/19              Patient will benefit from skilled therapeutic intervention in order to improve the following deficits and impairments:     Visit Diagnosis: Lumbar spondylosis  Chronic bilateral low back pain with right-sided sciatica  Pain in right hip     Problem List Patient Active Problem List   Diagnosis Date Noted  . Wheezing 02/17/2012  . Lumbar spondylosis 07/05/2011  . Lumbosacral spondylosis without myelopathy 03/21/2011  . INGUINAL PAIN, RIGHT 12/22/2009  . VITAMIN B12 DEFICIENCY 08/06/2009  . VITAMIN D DEFICIENCY 08/06/2009  . SINUSITIS- ACUTE-NOS 08/06/2009  . FATIGUE 08/06/2009  . RASH-NONVESICULAR 08/06/2009  . ERECTILE  DYSFUNCTION, ORGANIC 05/13/2009  . SKIN LESION 05/13/2009  . COUGH 10/28/2008  . ALLERGIC RHINITIS 03/25/2008  . HYPERCHOLESTEROLEMIA 12/29/2006  . POLYNEUROPATHY 12/29/2006  . HYPOTHYROIDISM 07/29/2006  . DIABETES MELLITUS, TYPE I 07/29/2006  . ANXIETY 07/29/2006    Ileana Ladd 05/20/2019, 3:44 PM  [facility] [address] [place], [place], [address] Phone: [phone]   Fax:  [phone]  Name: DEUNTA BENEKE MRN: [id number] Date of Birth: 1965/07/03

## 2019-05-24 ENCOUNTER — Other Ambulatory Visit: Payer: Self-pay

## 2019-05-24 ENCOUNTER — Ambulatory Visit

## 2019-05-24 DIAGNOSIS — M47816 Spondylosis without myelopathy or radiculopathy, lumbar region: Secondary | ICD-10-CM | POA: Diagnosis not present

## 2019-05-24 DIAGNOSIS — G8929 Other chronic pain: Secondary | ICD-10-CM

## 2019-05-24 DIAGNOSIS — M25551 Pain in right hip: Secondary | ICD-10-CM

## 2019-05-24 NOTE — Therapy (Signed)
[facility] [address] [place], [place], [address] Phone: [phone]   Fax:  [phone]  Physical Therapy Treatment  Patient Details  Name: Connor Holt MRN: [id number] Date of Birth: [birth year] Referring Provider (PT): Dr. Wynn Banker   Encounter Date: 05/24/2019    Past Medical History:  Diagnosis Date  . Anxiety   . Diabetes mellitus   . Dyslipidemia   . Hypothyroidism   . Low back pain     Past Surgical History:  Procedure Laterality Date  . CARDIAC CATHETERIZATION    . HERNIA REPAIR      There were no vitals filed for this visit.  Subjective Assessment - 05/24/19 1228    Subjective  He was sore after last time and back was little stiff but no groin pain since last session.    Pertinent History  Chronic LBP, DM    How long can you sit comfortably?  1 hours    How long can you stand comfortably?  30 min    Diagnostic tests  03/15/2010:  IMPRESSION: 1.  Moderate facet arthritis at L5 S1.  Small annular tear at L5S1, unchanged since the prior exam, based on the prior report.2.  Mild right facet arthritis at L4-5.3.  Specifically, the L1-2 disc is normal. 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    Patient Stated Goals  improve back pain and hip pain    Pain Onset  More than a month ago             Treatment: STM to bil lumbar praspinalis, quadratus lumborum Myofascial release to clear posterior and lateral iliac crest border on R  TherEx: Kneeling quadratus lumborum stretch: 10 x 10" holds Kneeling hip flexor stretch: 10 x 10" holds R only   Access Code: [email or web address] URL: [email or web address] Date: 05/13/2019 Prepared by: Lavone Nian  Exercises Dead Bug - 1 x daily - 7 x weekly - 1 sets - 20 reps  Access Code: [email or web address] URL:  [email or web address] Date: 05/24/2019 Prepared by: Lavone Nian  Exercises Half Kneeling Hip Flexor Stretch with Sidebend - 1 x daily - 7 x weekly - 1 sets - 10 reps - 10 hold Half Kneeling Hip Flexor Stretch - 1 x daily - 7 x weekly - 1 sets - 10 reps - 10 hold                       PT Short Term Goals - 04/29/19 1709      PT SHORT TERM GOAL #1   Title  Pt will report of pain <6/10 in R hip when triggered to improve ability to walk    Baseline  pain can be 9/10 when triggered, pt unable to put weight through R LE when pain is triggered    Time  2    Period  Weeks    Status  New    Target Date  05/13/19      PT SHORT TERM GOAL #2   Title  Pt will be able to perform yard work for 1 hour with pain in back/hip <5/10 to improve function    Baseline  7-8/10    Time  2    Period  Weeks    Status  New    Target Date  05/13/19        PT Long Term Goals -  04/29/19 1711      PT LONG TERM GOAL #1   Title  Pt will report R hip pain trigger only 1x/month to improve overall function    Baseline  2-4x/month    Time  4    Period  Weeks    Status  New    Target Date  05/27/19      PT LONG TERM GOAL #2   Title  Pt will report of back pain/r hip pain <5/10 at worst to improve overall function    Baseline  8-9/10 at worst    Time  4    Period  Weeks    Status  New    Target Date  05/27/19            Plan - 05/24/19 1252    Clinical Impression Statement  Pt is reporting gradual improvement in his hip pain.    Personal Factors and Comorbidities  Comorbidity 2    Comorbidities  DM, chronic back pain    Examination-Activity Limitations  Locomotion Level;Stairs;Stand    Examination-Participation Restrictions  Cleaning;Community Activity;Driving;Yard Work    Merchant navy officer  Evolving/Moderate complexity    Rehab Potential  Good    PT Frequency  2x / week    PT Duration  6 weeks    PT Treatment/Interventions  ADLs/Self Care  Home Management;Electrical Stimulation;Gait training;Stair training;Therapeutic exercise;Balance training;Neuromuscular re-education;Joint Manipulations    PT Next Visit Plan  Reassess    PT Home Exercise Plan  Access Code: [email or web address]: [email or web address]: 04/19/2021Prepared by: Gwenyth Bouillon PatelExercisesProne Press Up - 2 x daily - 7 x weekly - 1 sets - 10 repsCat-Camel - 2 x daily - 7 x weekly - 1 sets - 10 repsSidelying Hip Adduction - 2 x daily - 7 x weekly - 3 sets - 10 repsSeated Table Piriformis Stretch - 2 x daily - 7 x weekly - 1 sets - 3 reps - 30 hold       Patient will benefit from skilled therapeutic intervention in order to improve the following deficits and impairments:  Pain, Hypomobility  Visit Diagnosis: Chronic bilateral low back pain with right-sided sciatica  Pain in right hip  Lumbar spondylosis     Problem List Patient Active Problem List   Diagnosis Date Noted  . Wheezing 02/17/2012  . Lumbar spondylosis 07/05/2011  . Lumbosacral spondylosis without myelopathy 03/21/2011  . INGUINAL PAIN, RIGHT 12/22/2009  . VITAMIN B12 DEFICIENCY 08/06/2009  . VITAMIN D DEFICIENCY 08/06/2009  . SINUSITIS- ACUTE-NOS 08/06/2009  . FATIGUE 08/06/2009  . RASH-NONVESICULAR 08/06/2009  . 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 SKIN LESION 05/13/2009  . COUGH 10/28/2008  . ALLERGIC RHINITIS 03/25/2008  . HYPERCHOLESTEROLEMIA 12/29/2006  . POLYNEUROPATHY 12/29/2006  . HYPOTHYROIDISM 07/29/2006  . DIABETES MELLITUS, TYPE I 07/29/2006  . ANXIETY 07/29/2006    Connor Holt 05/24/2019, 1:06 PM  [facility] [address] [place], [place], [address] Phone: [phone]   Fax:  [phone]  Name: Connor Holt MRN: [id number] Date of Birth: February 02, 1965

## 2019-05-27 ENCOUNTER — Ambulatory Visit

## 2019-05-31 ENCOUNTER — Ambulatory Visit

## 2019-06-04 ENCOUNTER — Other Ambulatory Visit: Payer: Self-pay

## 2019-06-04 ENCOUNTER — Ambulatory Visit

## 2019-06-04 DIAGNOSIS — M47816 Spondylosis without myelopathy or radiculopathy, lumbar region: Secondary | ICD-10-CM

## 2019-06-04 DIAGNOSIS — G8929 Other chronic pain: Secondary | ICD-10-CM

## 2019-06-04 DIAGNOSIS — M25551 Pain in right hip: Secondary | ICD-10-CM

## 2019-06-04 NOTE — Therapy (Signed)
[facility] [address] [place], [place], [address] Phone: [phone]   Fax:  [phone]  Physical Therapy Treatment  Patient Details  Name: Connor Holt MRN: [id number] Date of Birth: 12/22/1965 Referring Provider (PT): Dr. Letta Pate   Encounter Date: 06/04/2019  PT End of Session - 06/04/19 1842    Visit Number  7    Number of Visits  9    Date for PT Re-Evaluation  07/02/19    PT Start Time  1700    PT Stop Time  1745    PT Time Calculation (min)  45 min    Activity Tolerance  Patient tolerated treatment well    Behavior During Therapy  [facility] for tasks assessed/performed       Past Medical History:  Diagnosis Date  . Anxiety   . Diabetes mellitus   . Dyslipidemia   . Hypothyroidism   . Low back pain     Past Surgical History:  Procedure Laterality Date  . CARDIAC CATHETERIZATION    . HERNIA REPAIR      There were no vitals filed for this visit.  Subjective Assessment - 06/04/19 1835    Subjective  Pt reports of being very ill for last couple of weeks and that is why he had to cancel PT sessions. Right before he got ill he was putting in the flooring and then he climbed up in the bed with his knee and felt something in the knee. Since then the knee has been sore For last 2 weeks he reports he hasnot had any of the sharp hip pains and back has been doing good. He had occaisonal tightness and dull ache but not constant. His knee is making him limp since it is swollen as well as sore to put weight on.    How long can you sit comfortably?  1 hours    How long can you stand comfortably?  30 min    Diagnostic tests  03/15/2010:  IMPRESSION: 1.  Moderate facet arthritis at L5 S1.  Small annular tear at L5S1, unchanged since the prior exam, based on the prior report.2.  Mild right facet arthritis at L4-5.3.  Specifically, the L1-2 disc is normal. 05/2010: IMPRESSION:   Probable chronic intrasubstance tear of  the right externalobturator muscle with secondary edema at the right side of thesymphysis pubis, probably a stress phenomenon.    Patient Stated Goals  improve back pain and hip pain    Currently in Pain?  Yes    Pain Score  4     Pain Location  Knee    Pain Orientation  Left    Pain Descriptors / Indicators  Aching;Tightness         [facility] PT Assessment - 06/04/19 1844      PROM   Right Hip Internal Rotation   45         TherEx: Heel slides with strap: 20x Prone knee flexion reviewed  Manual therapy: STM To bil quadratus lumborum, multifidus, lumbar praspinalis STM to L ITBand                     PT Short Term Goals - 06/04/19 1837      PT SHORT TERM GOAL #1   Title  Pt will report of pain <6/10 in R hip when triggered to improve ability to walk    Baseline  pain can be 9/10 when triggered, pt unable to put weight through R  LE when pain is triggered    Time  2    Period  Weeks    Status  Achieved    Target Date  05/13/19      PT SHORT TERM GOAL #2   Title  Pt will be able to perform yard work for 1 hour with pain in back/hip <5/10 to improve function    Baseline  7-8/10    Time  2    Period  Weeks    Status  Achieved    Target Date  05/13/19        PT Long Term Goals - 06/04/19 1837      PT LONG TERM GOAL #1   Title  Pt will report R hip pain trigger only 1x/month to improve overall function    Baseline  2-4x/month    Time  4    Period  Weeks    Status  Achieved      PT LONG TERM GOAL #2   Title  Pt will report of back pain/r hip pain <5/10 at worst to improve overall function    Baseline  8-9/10 at worst    Time  4    Period  Weeks    Status  Achieved            Plan - 06/04/19 1838    Clinical Impression Statement  Patient has been seen for total of 7 session for back pain and R hip pain. Patient is progressing well. Patient has reported minimal to no pain in back and R hip in last 2 weeks. Patient is compliant with HEP.  Patient is reporting recent exacerbation of L knee pain whihc is causing limping in his gait. Patient is seeing orthopedic MD for this. Patient will benefit from skilled PT for once a week for 4 more sessions.    Personal Factors and Comorbidities  Comorbidity 2    Comorbidities  DM, chronic back pain    Examination-Activity Limitations  Locomotion Level;Stairs;Stand    Examination-Participation Restrictions  Cleaning;Community Activity;Driving;Yard Work    Conservation officer, historic buildings  Evolving/Moderate complexity    Clinical Decision Making  Moderate    Rehab Potential  Good    PT Frequency  1x / week    PT Duration  4 weeks    PT Treatment/Interventions  ADLs/Self Care Home Management;Electrical Stimulation;Gait training;Stair training;Therapeutic exercise;Balance training;Neuromuscular re-education;Joint Manipulations    PT Next Visit Plan  --    PT Home Exercise Plan  Access Code: [email or web address]: [email or web address]: 04/19/2021Prepared by: Theone Murdoch PatelExercisesProne Press Up - 2 x daily - 7 x weekly - 1 sets - 10 repsCat-Camel - 2 x daily - 7 x weekly - 1 sets - 10 repsSidelying Hip Adduction - 2 x daily - 7 x weekly - 3 sets - 10 repsSeated Table Piriformis Stretch - 2 x daily - 7 x weekly - 1 sets - 3 reps - 30 hold       Patient will benefit from skilled therapeutic intervention in order to improve the following deficits and impairments:  Pain, Hypomobility  Visit Diagnosis: Chronic bilateral low back pain with right-sided sciatica  Pain in right hip  Lumbar spondylosis     Problem List Patient Active Problem List   Diagnosis Date Noted  . Wheezing 02/17/2012  . Lumbar spondylosis 07/05/2011  . Lumbosacral spondylosis without myelopathy 03/21/2011  . INGUINAL PAIN, RIGHT 12/22/2009  . VITAMIN B12 DEFICIENCY 08/06/2009  . VITAMIN D DEFICIENCY 08/06/2009  . SINUSITIS- ACUTE-NOS 08/06/2009  .  FATIGUE 08/06/2009  . RASH-NONVESICULAR 08/06/2009  .  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 SKIN LESION 05/13/2009  . COUGH 10/28/2008  . ALLERGIC RHINITIS 03/25/2008  . HYPERCHOLESTEROLEMIA 12/29/2006  . POLYNEUROPATHY 12/29/2006  . HYPOTHYROIDISM 07/29/2006  . DIABETES MELLITUS, TYPE I 07/29/2006  . ANXIETY 07/29/2006    Ileana Ladd 06/04/2019, 6:45 PM  [facility] [address] [place], [place], [address] Phone: [phone]   Fax:  [phone]  Name: Connor Holt MRN: [id number] Date of Birth: 1965/08/05

## 2019-06-05 DIAGNOSIS — M25562 Pain in left knee: Secondary | ICD-10-CM | POA: Insufficient documentation

## 2019-06-06 ENCOUNTER — Ambulatory Visit

## 2019-06-11 ENCOUNTER — Encounter: Admitting: Physical Medicine & Rehabilitation

## 2019-06-13 ENCOUNTER — Encounter: Attending: Physical Medicine & Rehabilitation | Admitting: Physical Medicine & Rehabilitation

## 2019-06-13 ENCOUNTER — Encounter: Payer: Self-pay | Admitting: Physical Medicine & Rehabilitation

## 2019-06-13 ENCOUNTER — Other Ambulatory Visit: Payer: Self-pay

## 2019-06-13 VITALS — BP 125/86 | HR 88 | Temp 97.5°F | Ht 73.0 in | Wt 237.0 lb

## 2019-06-13 DIAGNOSIS — G894 Chronic pain syndrome: Secondary | ICD-10-CM

## 2019-06-13 DIAGNOSIS — Z79891 Long term (current) use of opiate analgesic: Secondary | ICD-10-CM | POA: Insufficient documentation

## 2019-06-13 DIAGNOSIS — Z5181 Encounter for therapeutic drug level monitoring: Secondary | ICD-10-CM | POA: Diagnosis not present

## 2019-06-13 DIAGNOSIS — M76891 Other specified enthesopathies of right lower limb, excluding foot: Secondary | ICD-10-CM | POA: Diagnosis present

## 2019-06-13 NOTE — Progress Notes (Signed)
Subjective:    Patient ID: Connor Holt, male    DOB: Apr 30, 1965, 54 y.o.   MRN: [id number]  HPI Back and groin pain, improved after PT Keeping up with HEP, feels 95% better  Having problems with Right shoulder pain, sees Dr Aundria Rud , at [facility]   Hasn't taken tramadol for 2 wks.    Duloxetine dose reduced from 90 to 60mg  .    Pain Inventory Average Pain 3 Pain Right Now 2 My pain is sharp and aching  In the last 24 hours, has pain interfered with the following? General activity 0 Relation with others 0 Enjoyment of life 0 What TIME of day is your pain at its worst? evening Sleep (in general) Fair  Pain is worse with: walking and sitting Pain improves with: rest and medication Relief from Meds: 5  Mobility walk without assistance how many minutes can you walk? 30 ability to climb steps?  yes do you drive?  yes Do you have any goals in this area?  no  Function disabled: date disabled .  Neuro/Psych tingling trouble walking depression anxiety  Prior Studies Any changes since last visit?  no  Physicians involved in your care Any changes since last visit?  no   Family History  Problem Relation Age of Onset  . Diabetes Father    Social History   Socioeconomic History  . Marital status: Married    Spouse name: Not on file  . Number of children: Not on file  . Years of education: Not on file  . Highest education level: Not on file  Occupational History  . Not on file  Tobacco Use  . Smoking status: Never Smoker  . Smokeless tobacco: Current User    Types: Snuff  Substance and Sexual Activity  . Alcohol use: No  . Drug use: No  . Sexual activity: Not on file  Other Topics Concern  . Not on file  Social History Narrative  . Not on file   Social Determinants of Health   Financial Resource Strain:   . Difficulty of Paying Living Expenses:   Food Insecurity:   . Worried About in the Last Year:   . Programme researcher, broadcasting/film/video in the Last Year:   Transportation Needs:   . Engineer, site (Medical):   Freight forwarder Lack of Transportation (Non-Medical):   Physical Activity:   . Days of Exercise per Week:   . Minutes of Exercise per Session:   Stress:   . Feeling of Stress :   Social Connections:   . Frequency of Communication with Friends and Family:   . Frequency of Social Gatherings with Friends and Family:   . Attends Religious Services:   . Active Member of Clubs or Organizations:   . Attends [employer] Meetings:   Banker Marital Status:    Past Surgical History:  Procedure Laterality Date  . CARDIAC CATHETERIZATION    . HERNIA REPAIR     Past Medical History:  Diagnosis Date  . Anxiety   . Diabetes mellitus   . Dyslipidemia   . Hypothyroidism   . Low back pain    Temp (!) 97.5 F (36.4 C)   Ht 6\' 1"  (1.854 m)   Wt 237 lb (107.5 kg)   BMI 31.27 kg/m   Opioid Risk Score:   Fall Risk Score:  `1  Depression screen PHQ 2/9  Depression screen [facility] 2/9 03/31/2015 09/30/2014  Decreased Interest 0 0  Down, Depressed, Hopeless 0 0  PHQ - 2 Score 0 0  Altered sleeping 2 -  Tired, decreased energy 1 -  Change in appetite 1 -  Feeling bad or failure about yourself  0 -  Trouble concentrating 0 -  Moving slowly or fidgety/restless 1 -  Suicidal thoughts 0 -  PHQ-9 Score 5 -    Review of Systems  Constitutional: Negative.   HENT: Negative.   Eyes: Negative.   Respiratory: Negative.   Cardiovascular: Negative.   Gastrointestinal: Negative.   Endocrine: Negative.   Genitourinary: Negative.   Musculoskeletal: Negative.   Skin: Negative.   Allergic/Immunologic: Negative.   Neurological: Negative.   Hematological: Negative.   Psychiatric/Behavioral: Positive for dysphoric mood. The patient is nervous/anxious.   All other systems reviewed and are negative.      Objective:   Physical Exam Vitals and nursing note reviewed.  Constitutional:      Appearance: Normal appearance.   HENT:     Head: Normocephalic and atraumatic.  Eyes:     Extraocular Movements: Extraocular movements intact.     Conjunctiva/sclera: Conjunctivae normal.     Pupils: Pupils are equal, round, and reactive to light.  Musculoskeletal:        General: No tenderness or deformity.     Comments: Good range of motion with lumbar flexion and extension lateral bending and rotation. Ambulates without assist device no evidence of toe drag or knee stability Negative straight leg raising  Skin:    General: Skin is warm and dry.  Neurological:     Mental Status: He is alert and oriented to person, place, and time. Mental status is at baseline.     Comments: Motor strength is 5/5 bilateral hip flexor knee extensor ankle dorsiflexor  Psychiatric:        Mood and Affect: Mood normal.        Behavior: Behavior normal.           Assessment & Plan:  1.  Myofascial pain Right hip girdle improved after physical therapy. The patient is no longer taking tramadol. I do not think he needs UDS testing because this is going to be used only very sporadically in the future. I will see him back on a as needed basis No need for any spinal injection interventions. He has been advised to keep up with his home exercise program on a daily basis. Thus far he has been very compliant.

## 2019-10-02 ENCOUNTER — Other Ambulatory Visit: Payer: Self-pay | Admitting: Physician Assistant

## 2019-10-02 DIAGNOSIS — U071 COVID-19: Secondary | ICD-10-CM

## 2019-10-02 DIAGNOSIS — E663 Overweight: Secondary | ICD-10-CM

## 2019-10-02 DIAGNOSIS — E1069 Type 1 diabetes mellitus with other specified complication: Secondary | ICD-10-CM

## 2019-10-02 NOTE — Progress Notes (Signed)
I connected by phone with Connor Holt on 10/02/2019 at 6:53 PM to discuss the potential use of a new treatment for mild to moderate COVID-19 viral infection in non-hospitalized patients.  This patient is a 54 y.o. male that meets the FDA criteria for Emergency Use Authorization of COVID monoclonal antibody casirivimab/imdevimab.  Has a (+) direct SARS-CoV-2 viral test result  Has mild or moderate COVID-19   Is NOT hospitalized due to COVID-19  Is within 10 days of symptom onset  Has at least one of the high risk factor(s) for progression to severe COVID-19 and/or hospitalization as defined in EUA.  Specific high risk criteria : BMI > 25 and Diabetes   I have spoken and communicated the following to the patient or parent/caregiver regarding COVID monoclonal antibody treatment:  1. FDA has authorized the emergency use for the treatment of mild to moderate COVID-19 in adults and pediatric patients with positive results of direct SARS-CoV-2 viral testing who are 47 years of age and older weighing at least 40 kg, and who are at high risk for progressing to severe COVID-19 and/or hospitalization.  2. The significant known and potential risks and benefits of COVID monoclonal antibody, and the extent to which such potential risks and benefits are unknown.  3. Information on available alternative treatments and the risks and benefits of those alternatives, including clinical trials.  4. Patients treated with COVID monoclonal antibody should continue to self-isolate and use infection control measures (e.g., wear mask, isolate, social distance, avoid sharing personal items, clean and disinfect "high touch" surfaces, and frequent handwashing) according to CDC guidelines.   5. The patient or parent/caregiver has the option to accept or refuse COVID monoclonal antibody treatment.  After reviewing this information with the patient, The patient agreed to proceed with receiving  casirivimab\imdevimab infusion and will be provided a copy of the Fact sheet prior to receiving the infusion.  Sx onset 9/15 . Set up for infusion on 9/23 @ 4:30pm. Directions given to [facility]. Pt is aware that insurance will be charged an infusion fee. Pt is unvaccinated.   Cline Crock 10/02/2019 6:53 PM

## 2019-10-03 ENCOUNTER — Other Ambulatory Visit (HOSPITAL_COMMUNITY): Payer: Self-pay

## 2019-10-03 ENCOUNTER — Ambulatory Visit (HOSPITAL_COMMUNITY)
Admission: RE | Admit: 2019-10-03 | Discharge: 2019-10-03 | Disposition: A | Discharge status: Home / Self Care | Source: Ambulatory Visit | Attending: Pulmonary Disease | Admitting: Pulmonary Disease

## 2019-10-03 DIAGNOSIS — E663 Overweight: Secondary | ICD-10-CM | POA: Insufficient documentation

## 2019-10-03 DIAGNOSIS — E1069 Type 1 diabetes mellitus with other specified complication: Secondary | ICD-10-CM

## 2019-10-03 DIAGNOSIS — U071 COVID-19: Secondary | ICD-10-CM | POA: Diagnosis present

## 2019-10-03 MED ORDER — FAMOTIDINE IN NACL 20-0.9 MG/50ML-% IV SOLN: 20.0000 mg | Freq: Once | INTRAVENOUS | Status: DC | PRN

## 2019-10-03 MED ORDER — ACETAMINOPHEN 500 MG PO TABS
1000.0000 mg | ORAL_TABLET | Freq: Once | ORAL | Status: AC
  Administered 2019-10-03: 1000 mg via ORAL
  Filled 2019-10-03: qty 2

## 2019-10-03 MED ORDER — DIPHENHYDRAMINE HCL 50 MG/ML IJ SOLN: 50.0000 mg | Freq: Once | INTRAMUSCULAR | Status: DC | PRN

## 2019-10-03 MED ORDER — ALBUTEROL SULFATE HFA 108 (90 BASE) MCG/ACT IN AERS: 2.0000 | INHALATION_SPRAY | Freq: Once | RESPIRATORY_TRACT | Status: DC | PRN

## 2019-10-03 MED ORDER — METHYLPREDNISOLONE SODIUM SUCC 125 MG IJ SOLR: 125.0000 mg | Freq: Once | INTRAMUSCULAR | Status: DC | PRN

## 2019-10-03 MED ORDER — EPINEPHRINE 0.3 MG/0.3ML IJ SOAJ: 0.3000 mg | Freq: Once | INTRAMUSCULAR | Status: DC | PRN

## 2019-10-03 MED ORDER — ACETAMINOPHEN 325 MG PO TABS: 650.0000 mg | ORAL_TABLET | Freq: Once | ORAL | Status: DC

## 2019-10-03 MED ORDER — SODIUM CHLORIDE 0.9 % IV SOLN: Freq: Once | INTRAVENOUS | Status: AC

## 2019-10-03 MED ORDER — SODIUM CHLORIDE 0.9 % IV SOLN: INTRAVENOUS | Status: DC | PRN

## 2019-10-03 MED ORDER — SODIUM CHLORIDE 0.9 % IV SOLN
1200.0000 mg | Freq: Once | INTRAVENOUS | Status: AC
  Administered 2019-10-03: 1200 mg via INTRAVENOUS

## 2019-10-03 NOTE — Discharge Instructions (Signed)

## 2019-10-03 NOTE — Progress Notes (Signed)
  Diagnosis: COVID-19  Physician: Dr. Wright  Procedure: [facility] Med: casirivimab\imdevimab infusion - Provided patient with casirivimab\imdevimab fact sheet for patients, parents and caregivers prior to infusion.  Complications: No immediate complications noted.  Discharge: Discharged home   Connor Holt 10/03/2019   

## 2019-10-15 DIAGNOSIS — U071 COVID-19: Secondary | ICD-10-CM | POA: Insufficient documentation

## 2020-01-21 ENCOUNTER — Telehealth: Payer: Self-pay | Admitting: *Deleted

## 2020-01-21 MED ORDER — METHOCARBAMOL 500 MG PO TABS: 500.0000 mg | ORAL_TABLET | Freq: Three times a day (TID) | ORAL | 0 refills | Status: DC | PRN

## 2020-01-21 MED ORDER — TRAMADOL HCL 50 MG PO TABS: 100.0000 mg | ORAL_TABLET | Freq: Four times a day (QID) | ORAL | 0 refills | Status: DC | PRN

## 2020-01-21 NOTE — Telephone Encounter (Signed)
error 

## 2020-01-23 ENCOUNTER — Telehealth: Payer: Self-pay | Admitting: Physical Medicine & Rehabilitation

## 2020-01-23 MED ORDER — TRAMADOL HCL 50 MG PO TABS: 50.0000 mg | ORAL_TABLET | Freq: Four times a day (QID) | ORAL | 0 refills | Status: DC | PRN

## 2020-01-23 NOTE — Telephone Encounter (Signed)
Previous prescription was phone in rather than sent in through the epic system.  Have corrected this.  1 week supply of tramadol for flareup of low back pain.  Caution for serotonin syndrome with concomitant Cymbalta, therefore limiting to 200 mg/day

## 2020-01-23 NOTE — Telephone Encounter (Signed)
Patient informed. 

## 2020-01-30 ENCOUNTER — Telehealth: Payer: Self-pay | Admitting: *Deleted

## 2020-01-30 MED ORDER — TRAMADOL HCL 50 MG PO TABS: 50.0000 mg | ORAL_TABLET | Freq: Four times a day (QID) | ORAL | 0 refills | Status: DC | PRN

## 2020-01-30 NOTE — Telephone Encounter (Signed)
Mr Maestas called to request a refill on his tramadol until he can see Dr Wynn Banker on 02/07/20.  Approved by Dr Wynn Banker and called to pharmacy and Mr Schmaltz notified.

## 2020-02-07 ENCOUNTER — Other Ambulatory Visit: Payer: Self-pay

## 2020-02-07 ENCOUNTER — Encounter: Payer: Self-pay | Admitting: Physical Medicine & Rehabilitation

## 2020-02-07 ENCOUNTER — Encounter: Payer: Medicare Other | Attending: Physical Medicine & Rehabilitation | Admitting: Physical Medicine & Rehabilitation

## 2020-02-07 ENCOUNTER — Telehealth: Payer: Self-pay

## 2020-02-07 VITALS — BP 161/96 | HR 85 | Temp 98.6°F | Ht 73.0 in | Wt 247.0 lb

## 2020-02-07 DIAGNOSIS — R1031 Right lower quadrant pain: Secondary | ICD-10-CM | POA: Insufficient documentation

## 2020-02-07 NOTE — Progress Notes (Signed)
Subjective:    Patient ID: Connor Holt, male    DOB: 08-Jun-1965, 55 y.o.   MRN: [id number]  HPI Right Inguinal and perineal pain, the inguinal area pain is similar to what he had in 2012 but the perineal pain appears to be new. 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 Not quite as much pain when he is a passenger. The patient has had no falls. He has no pain with walking except that if he has been sitting for a while and gets up to walk it takes a little while to loosen up. He denies any problems with his bladder, no pain with intercourse. No problems with his bowels. We reviewed his work-up in 2012 which included referral to sports medicine to check for sports hernia which was negative referral to urology to check for hernia as well as prostate issues which was negative at that time. In addition the patient saw general surgery and they did not see any sign of inguinal hernia. The patient denies any sciatic pain. He has a history of chronic low back pain. His most recent lumbar MRI was in 2012 and it did not show any signs of L1-L2 disc to correlate with inguinal pain. 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 The patient also had hip MRI in 2012 which showed an obturator externus chronic intrasubstance tear no other significant abnormalities.  The patient denies any fevers urine has been a little dark but otherwise no other abnormalities noted.  The patient is a type I diabetic managed with an insulin pump Pain Inventory Average Pain 6 Pain Right Now 4 My pain is sharp, stabbing and aching  In the last 24 hours, has pain interfered with the following? General activity 7 Relation with others 7 Enjoyment of life 6 What TIME of day is your pain at its worst? varies Sleep (in general) Poor  Pain is worse with: walking and sitting Pain improves with: rest and  medication Relief from Meds: 5  RADIOLOGY REPORT*   Clinical Data: Right groin and hip and right upper leg pain for 4  months. The   MRI OF THE RIGHT HIP WITHOUT CONTRAST   Technique: Multiplanar, multisequence MR imaging was performed. No  intravenous contrast was administered.   Comparison: None.   Findings: There is abnormal edema in the right external obturator  muscle extending to the origin at the right side of the symphysis  pubis. There is edema in the right side of the symphysis pubis  with a small erosion of the right side of the symphysis. I suspect  this abnormality represents a partial tear of the obturator  externus muscle with a secondary stress injury at the symphysis  pubis.   There is no discrete mass. The muscle is not enlarged.   The other osseous structures are normal. There is no hip effusion  or bursitis. The other muscle structures around the hips are  normal.   There is no adenopathy. There is a small left inguinal hernia  containing only fat.   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   Original Report Authenticated By: Gwynn Burly, M.D.  Family History  Problem Relation Age of Onset  . Diabetes Father    Social History  Socioeconomic History  . Marital status: Married    Spouse name: Not on file  . Number of children: Not on file  . Years of education: Not on file  . Highest education level: Not on file  Occupational History  . Not on file  Tobacco Use  . Smoking status: Never Smoker  . Smokeless tobacco: Current User    Types: Snuff  Substance and Sexual Activity  . Alcohol use: No  . Drug use: No  . Sexual activity: Not on file  Other Topics Concern  . Not on file  Social History Narrative  . Not on file   Social Determinants of Health   Financial Resource Strain:  Not on file  Food Insecurity: Not on file  Transportation Needs: Not on file  Physical Activity: Not on file  Stress: Not on file  Social Connections: Not on file   Past Surgical History:  Procedure Laterality Date  . CARDIAC CATHETERIZATION    . HERNIA REPAIR     Past Surgical History:  Procedure Laterality Date  . CARDIAC CATHETERIZATION    . HERNIA REPAIR     Past Medical History:  Diagnosis Date  . Anxiety   . Diabetes mellitus   . Dyslipidemia   . Hypothyroidism   . Low back pain    BP (!) 161/96   Pulse 85   Temp 98.6 F (37 C)   Ht 6\' 1"  (1.854 m)   Wt 247 lb (112 kg)   SpO2 94%   BMI 32.59 kg/m   Opioid Risk Score:   Fall Risk Score:  `1  Depression screen PHQ 2/9  Depression screen [facility] 2/9 03/31/2015 09/30/2014  Decreased Interest 0 0  Down, Depressed, Hopeless 0 0  PHQ - 2 Score 0 0  Altered sleeping 2 -  Tired, decreased energy 1 -  Change in appetite 1 -  Feeling bad or failure about yourself  0 -  Trouble concentrating 0 -  Moving slowly or fidgety/restless 1 -  Suicidal thoughts 0 -  PHQ-9 Score 5 -   Review of Systems  Musculoskeletal:       Hip pain Pelvic area  All other systems reviewed and are negative.      Objective:   Physical Exam Vitals and nursing note reviewed.  Constitutional:      Appearance: He is obese.  HENT:     Head: Normocephalic and atraumatic.  Eyes:     Extraocular Movements: Extraocular movements intact.     Conjunctiva/sclera: Conjunctivae normal.     Pupils: Pupils are equal, round, and reactive to light.  Genitourinary:    Penis: Normal.      Testes: Normal.     Comments: No evidence of inguinal hernia bilaterally  Scrotum shows no evidence of fullness  No tenderness to palpation over the perineum Musculoskeletal:     Comments: No pain to palpation over the lumbar spine No pain around the sacral area. No tenderness around the gluteal musculature There is mild pain around the greater trochanter on  the right side but not on the left side. Gaenslens: - Sacral thrust (prone) :- Lateral compression:- FABER's: - Distraction (supine):-   Neurological:     Mental Status: He is alert.     Comments: Negative straight leg raising 5/5 strength bilateral hip flexor knee extensor ankle dorsiflexor There is no sensory loss around the L1 or L2 or T12 dermatomal distribution Gait is normal  Assessment & Plan:  #1. Right hip, inguinal as well as perineal pain. We discussed differential diagnosis. The nerve distribution appears to be most consistent with iliohypogastric but also has the perineum pain with sitting. There is no sign of inguinal hernia. No other abnormalities on examination. We will check CT of the abdomen pelvis to see if there is any mass that could be potentially pressing on the iliohypogastric or ilioinguinal nerves. We will make referral to urology for prostate check. We discussed pain medications including nerve pain medication we did recently up his gabapentin to 400 mg 3 times daily The patient feels like his muscle relaxer methocarbamol is beneficial however the tramadol was not very helpful. Physical medicine rehab follow-up in 1 month

## 2020-02-07 NOTE — Telephone Encounter (Signed)
Crystal with [facility] called [phone]). Please change the CT order to CT abdomen pelvic with contrast. Please change  CPT CODE 33744.

## 2020-02-13 ENCOUNTER — Encounter: Payer: Self-pay | Admitting: Physical Medicine & Rehabilitation

## 2020-02-13 ENCOUNTER — Telehealth: Payer: Self-pay | Admitting: Physical Medicine & Rehabilitation

## 2020-02-13 ENCOUNTER — Other Ambulatory Visit: Payer: Self-pay | Admitting: Physical Medicine & Rehabilitation

## 2020-02-13 MED ORDER — ACETAMINOPHEN-CODEINE #3 300-30 MG PO TABS: 1.0000 | ORAL_TABLET | Freq: Three times a day (TID) | ORAL | 0 refills | Status: DC | PRN

## 2020-02-13 NOTE — Telephone Encounter (Signed)
Please inform patient of prescription for Tylenol with codeine sent to [facility] and [address] Discontinue tramadol

## 2020-02-13 NOTE — Telephone Encounter (Signed)
Patient notified

## 2020-02-13 NOTE — Telephone Encounter (Signed)
Patient wanted to let Dr. Wynn Banker know that the 50mg  Tramadol is not helping him, and that he is out of it.  Would like to know if he can prescribe anything else.  Please advise.

## 2020-02-20 ENCOUNTER — Other Ambulatory Visit: Payer: Self-pay

## 2020-02-20 ENCOUNTER — Other Ambulatory Visit: Payer: Self-pay | Admitting: Physical Medicine & Rehabilitation

## 2020-02-20 ENCOUNTER — Telehealth: Payer: Self-pay | Admitting: *Deleted

## 2020-02-20 MED ORDER — ACETAMINOPHEN-CODEINE #3 300-30 MG PO TABS: 1.0000 | ORAL_TABLET | Freq: Three times a day (TID) | ORAL | 0 refills | Status: DC | PRN

## 2020-02-20 MED ORDER — METHOCARBAMOL 500 MG PO TABS: 500.0000 mg | ORAL_TABLET | Freq: Three times a day (TID) | ORAL | 0 refills | Status: DC | PRN

## 2020-02-20 MED ORDER — METHOCARBAMOL 500 MG PO TABS: ORAL_TABLET | ORAL | 0 refills | Status: DC

## 2020-02-20 MED ORDER — ACETAMINOPHEN-CODEINE #3 300-30 MG PO TABS: ORAL_TABLET | ORAL | 0 refills | Status: DC

## 2020-02-20 NOTE — Telephone Encounter (Signed)
Tylenol # 3 refiull #42 verified to be called to [facility] on [address] and not to the [facility] on [address] in [place],

## 2020-02-25 ENCOUNTER — Ambulatory Visit
Admission: RE | Admit: 2020-02-25 | Discharge: 2020-02-25 | Disposition: A | Discharge status: Home / Self Care | Payer: Medicare Other | Source: Ambulatory Visit | Attending: Physical Medicine & Rehabilitation | Admitting: Physical Medicine & Rehabilitation

## 2020-02-25 DIAGNOSIS — R1031 Right lower quadrant pain: Secondary | ICD-10-CM

## 2020-02-25 MED ORDER — IOPAMIDOL (ISOVUE-300) INJECTION 61%
100.0000 mL | Freq: Once | INTRAVENOUS | Status: AC | PRN
  Administered 2020-02-25: 100 mL via INTRAVENOUS

## 2020-02-26 ENCOUNTER — Other Ambulatory Visit: Payer: Self-pay | Admitting: Physical Medicine & Rehabilitation

## 2020-03-04 ENCOUNTER — Other Ambulatory Visit: Payer: Self-pay | Admitting: Physical Medicine & Rehabilitation

## 2020-03-05 ENCOUNTER — Telehealth: Payer: Self-pay | Admitting: *Deleted

## 2020-03-05 MED ORDER — ACETAMINOPHEN-CODEINE #3 300-30 MG PO TABS: ORAL_TABLET | ORAL | 0 refills | Status: DC

## 2020-03-05 MED ORDER — METHOCARBAMOL 500 MG PO TABS: ORAL_TABLET | ORAL | 0 refills | Status: DC

## 2020-03-05 NOTE — Telephone Encounter (Signed)
Mr Frady called because he was told his request to refill his tylenol #3 was denied at the pharmacy.  I checked and he was supposed to have a refill. I called the pharmacy and was told the actual store was closing down and was reopening in a new location 03/05/20. The pharmacy closed at 3 pm and I was unable to speak to anyone.  I called back and left a VM with them telling them that he was supposed to have another refill.  Dr Wynn Banker attempted to send a prescription over and a refill error message was posted.  I have called Thayer Ohm back and left a VM informing him of what was going on. The new store is open but they are having issues with their system and was told he should call after 12n today. I have relayed that on his VM.

## 2020-03-05 NOTE — Telephone Encounter (Signed)
The current [facility] that Mr Filley uses on [address] is having problems with their system and he cannot get his medication.  I have canceled the Tylenol #3 at that location and called it to the [facility] on [address] in [place] along with his methocarbamol. I have notified Mr Dry by VM.

## 2020-03-06 ENCOUNTER — Ambulatory Visit: Payer: Medicare Other | Admitting: Physical Medicine & Rehabilitation

## 2020-03-11 ENCOUNTER — Other Ambulatory Visit: Payer: Self-pay | Admitting: Physical Medicine & Rehabilitation

## 2020-03-18 ENCOUNTER — Telehealth: Payer: Self-pay

## 2020-03-19 ENCOUNTER — Telehealth: Payer: Self-pay | Admitting: *Deleted

## 2020-03-19 MED ORDER — ACETAMINOPHEN-CODEINE #3 300-30 MG PO TABS: ORAL_TABLET | ORAL | 0 refills | Status: DC

## 2020-03-19 NOTE — Telephone Encounter (Signed)
Error

## 2020-03-19 NOTE — Telephone Encounter (Signed)
Connor Holt called for a refill on his medication. Tylenol #3 called to [facility] on corner of [address]

## 2020-03-24 ENCOUNTER — Encounter: Payer: Medicare Other | Admitting: Physical Medicine & Rehabilitation

## 2020-03-26 ENCOUNTER — Telehealth: Payer: Self-pay | Admitting: *Deleted

## 2020-03-26 MED ORDER — METHOCARBAMOL 500 MG PO TABS: ORAL_TABLET | ORAL | 0 refills | Status: DC

## 2020-03-26 MED ORDER — ACETAMINOPHEN-CODEINE #3 300-30 MG PO TABS
ORAL_TABLET | ORAL | 0 refills | Status: DC
Start: 2020-03-26 — End: 2020-04-09

## 2020-03-26 NOTE — Telephone Encounter (Signed)
Connor Holt called back to see if Rx will be filled. If granted, please send to [facility].

## 2020-03-26 NOTE — Telephone Encounter (Signed)
Connor Holt called and reports he had to cancel his 03/24/20 appt due to illness. He had a GI bug but was afraid it might be covid. He did a test and reports it is negative.  Appt moved to 04/16/20 and he is asking for refills on his tylenol #3 and methocarbamol.

## 2020-03-27 NOTE — Telephone Encounter (Signed)
Notified. 

## 2020-04-09 ENCOUNTER — Telehealth: Payer: Self-pay | Admitting: *Deleted

## 2020-04-09 MED ORDER — TRAMADOL HCL 50 MG PO TABS: 50.0000 mg | ORAL_TABLET | Freq: Three times a day (TID) | ORAL | 0 refills | Status: DC | PRN

## 2020-04-09 NOTE — Telephone Encounter (Signed)
Connor Holt is asking why the change to tramadol when Tyl #3 was working and the copay is $48 verses $1.87. I explained that Dr Wynn Banker is trying to wean him off the codeine because he does not want him on it long term.

## 2020-04-09 NOTE — Telephone Encounter (Signed)
Per Dr Wynn Banker, Tramadol 50 mg #21 RF0 called to [facility] at [address] [place] is aware.

## 2020-04-16 ENCOUNTER — Other Ambulatory Visit: Payer: Self-pay

## 2020-04-16 ENCOUNTER — Encounter: Payer: Self-pay | Admitting: Physical Medicine & Rehabilitation

## 2020-04-16 ENCOUNTER — Encounter: Payer: Medicare Other | Attending: Physical Medicine & Rehabilitation | Admitting: Physical Medicine & Rehabilitation

## 2020-04-16 VITALS — BP 136/84 | HR 78 | Temp 98.3°F | Ht 73.0 in | Wt 227.4 lb

## 2020-04-16 DIAGNOSIS — M76891 Other specified enthesopathies of right lower limb, excluding foot: Secondary | ICD-10-CM | POA: Insufficient documentation

## 2020-04-16 DIAGNOSIS — M6289 Other specified disorders of muscle: Secondary | ICD-10-CM | POA: Insufficient documentation

## 2020-04-16 MED ORDER — ACETAMINOPHEN-CODEINE #3 300-30 MG PO TABS: 1.0000 | ORAL_TABLET | Freq: Three times a day (TID) | ORAL | 0 refills | Status: DC | PRN

## 2020-04-16 NOTE — Progress Notes (Signed)
Subjective:    Patient ID: Connor Holt, male    DOB: [birth year], 55 y.o.   MRN: [id number]  HPI 55 year old male with history of type 1 diabetes requiring insulin pump, lumbar spondylosis without myelopathy as well as history of right hip and groin pain who returns today for follow-up of his right groin pain complaints. A CT of the abdomen and pelvis was ordered and has been completed which did not show any worrisome intra-abdominal process, retroperitoneal process or signs of neoplasm to explain any of his right inguinal/groin pain complaints. In the interval time the patient has gone to the urologist who checked his prostate, patient states no prostate abnormalities were found. The patient has had some bladder issues i.e. nocturia.  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 The patient has been going to a physical therapist who specializes in pelvic floor myalgias.  He has been through 2 sessions and feels like it has been beneficial thus far. In addition the patient has lost about 20 pounds through a combination of diet and exercise.  The patient is trying to build up his core strength both in the gym and with his physical therapist.   Still swimming  Pain Inventory Average Pain 6 Pain Right Now 8 My pain is sharp, burning and aching  In the last 24 hours, has pain interfered with the following? General activity 5 Relation with others 4 Enjoyment of life 4 What TIME of day is your pain at its worst? varies Sleep (in general) Fair  Pain is worse with: walking, standing and some activites Pain improves with: rest, therapy/exercise and medication Relief from Meds: 6  Family History  Problem Relation Age of Onset  . Diabetes Father    Social History   Socioeconomic History  . Marital status: Married    Spouse name: Not on file  . Number of children: Not on file  .  Years of education: Not on file  . Highest education level: Not on file  Occupational History  . Not on file  Tobacco Use  . Smoking status: Never Smoker  . Smokeless tobacco: Current User    Types: Snuff  Substance and Sexual Activity  . Alcohol use: No  . Drug use: No  . Sexual activity: Not on file  Other Topics Concern  . Not on file  Social History Narrative  . Not on file   Social Determinants of Health   Financial Resource Strain: Not on file  Food Insecurity: Not on file  Transportation Needs: Not on file  Physical Activity: Not on file  Stress: Not on file  Social Connections: Not on file   Past Surgical History:  Procedure Laterality Date  . CARDIAC CATHETERIZATION    . HERNIA REPAIR     Past Surgical History:  Procedure Laterality Date  . CARDIAC CATHETERIZATION    . HERNIA REPAIR     Past Medical History:  Diagnosis Date  . Anxiety   . Diabetes mellitus   . Dyslipidemia   . Hypothyroidism   . Low back pain    BP 136/84   Pulse 78   Temp 98.3 F (36.8 C)   Ht 6\' 1"  (1.854 m)   Wt 227 lb 6.4 oz (103.1 kg)   SpO2 99%   BMI 30.00 kg/m   Opioid Risk Score:   Fall Risk Score:  `1  Depression screen [facility] 2/9  Depression screen [facility] 2/9 03/31/2015 09/30/2014  Decreased Interest 0 0  Down, Depressed, Hopeless 0 0  PHQ - 2 Score 0 0  Altered sleeping 2 -  Tired, decreased energy 1 -  Change in appetite 1 -  Feeling bad or failure about yourself  0 -  Trouble concentrating 0 -  Moving slowly or fidgety/restless 1 -  Suicidal thoughts 0 -  PHQ-9 Score 5 -    Review of Systems  Musculoskeletal: Positive for back pain.       Leg, buttocks, pelvic pain  All other systems reviewed and are negative.      Objective:   Physical Exam Vitals and nursing note reviewed.  Constitutional:      Appearance: He is obese.  HENT:     Head: Normocephalic and atraumatic.  Eyes:     Extraocular Movements: Extraocular movements intact.      Conjunctiva/sclera: Conjunctivae normal.     Pupils: Pupils are equal, round, and reactive to light.  Genitourinary:    Penis: Normal.      Testes: Normal.     Comments: No evidence of mass in the inguinal area or in the scrotal area Musculoskeletal:        General: No swelling or tenderness.     Cervical back: Normal range of motion.     Comments:    FABER's: Negative Distraction (supine): Negative Thigh thrust test: Negative  There is mild pain with hip internal rotation on the right side.  No tenderness over the croak bursa no tenderness over the right PSIS or in the lumbar area  There is tenderness at the right ischial tuberosity  Skin:    General: Skin is warm and dry.  Neurological:     General: No focal deficit present.     Mental Status: He is alert and oriented to person, place, and time.     Comments: Normal sensation in the inguinal crease and medial thigh bilaterally Motor strength is 5/5 bilateral hip flexor knee extensor ankle dorsiflex.  Psychiatric:        Mood and Affect: Mood normal.        Behavior: Behavior normal.           Assessment & Plan:  #1.  Right groin pain likely multifactorial the patient likely has some neuropathic pain related to his diabetes along the iliohypogastric region. In addition the patient has ischial bursa pain as well as pelvic floor myalgia.  Recommend continued community-based exercise as well as physical therapy focusing on his pelvic floor dysfunction. His pain is exacerbated by longer car trips he has a trip to the beach Friday through Tuesday, will give 5-day supply of Tylenol 3 1 p.o. 3 times daily as needed #15 no refills.  As discussed with the patient we will not be refilling this. The patient may benefit from initial dosage or other cushioning to help with his ischial pain We discussed changing his neuropathic pain medication however he does not wish to try anything new and in fact he feels like he would like to trim  back on his medication list if possible Physical medicine rehab follow-up in 6 weeks

## 2020-04-23 ENCOUNTER — Telehealth: Payer: Self-pay | Admitting: *Deleted

## 2020-04-23 MED ORDER — METHOCARBAMOL 500 MG PO TABS: ORAL_TABLET | ORAL | 0 refills | Status: DC

## 2020-04-23 MED ORDER — TRAMADOL HCL 50 MG PO TABS: 50.0000 mg | ORAL_TABLET | Freq: Three times a day (TID) | ORAL | 0 refills | Status: DC | PRN

## 2020-04-23 NOTE — Telephone Encounter (Signed)
Per Dr Wynn Banker, Tramadol 50 mg 1 po tid #90 0RF [facility] to [facility].

## 2020-04-23 NOTE — Addendum Note (Signed)
Addended by: Erick Colace on: 04/23/2020 03:09 PM   Modules accepted: Orders

## 2020-04-27 DIAGNOSIS — M65311 Trigger thumb, right thumb: Secondary | ICD-10-CM | POA: Insufficient documentation

## 2020-04-29 DIAGNOSIS — M189 Osteoarthritis of first carpometacarpal joint, unspecified: Secondary | ICD-10-CM | POA: Insufficient documentation

## 2020-05-28 ENCOUNTER — Encounter: Payer: Medicare Other | Attending: Physical Medicine & Rehabilitation | Admitting: Physical Medicine & Rehabilitation

## 2020-05-28 ENCOUNTER — Other Ambulatory Visit: Payer: Self-pay

## 2020-05-28 ENCOUNTER — Encounter: Payer: Self-pay | Admitting: Physical Medicine & Rehabilitation

## 2020-05-28 VITALS — BP 145/81 | HR 88 | Temp 98.6°F | Ht 73.0 in | Wt 226.6 lb

## 2020-05-28 DIAGNOSIS — R1031 Right lower quadrant pain: Secondary | ICD-10-CM | POA: Insufficient documentation

## 2020-05-28 DIAGNOSIS — G8929 Other chronic pain: Secondary | ICD-10-CM | POA: Insufficient documentation

## 2020-05-28 MED ORDER — GABAPENTIN 600 MG PO TABS: 600.0000 mg | ORAL_TABLET | Freq: Three times a day (TID) | ORAL | 2 refills | Status: AC

## 2020-05-28 NOTE — Patient Instructions (Signed)
Referral to Dr Cherylann Ratel for nerve block Right iliohypogastric nerve

## 2020-05-28 NOTE — Progress Notes (Signed)
Subjective:    Patient ID: Connor Holt, male    DOB: 02-16-1965, 55 y.o.   MRN: [id number] Right Inguinal and perineal pain, the inguinal area pain is similar to what he had in 2012 but the perineal pain appears to be new. 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 Not quite as much pain when he is a passenger. The patient has had no falls. He has no pain with walking except that if he has been sitting for a while and gets up to walk it takes a little while to loosen up. He denies any problems with his bladder, no pain with intercourse. No problems with his bowels. We reviewed his work-up in 2012 which included referral to sports medicine to check for sports hernia which was negative referral to urology to check for hernia as well as prostate issues which was negative at that time. In addition the patient saw general surgery and they did not see any sign of inguinal hernia. The patient denies any sciatic pain. He has a history of chronic low back pain. His most recent lumbar MRI was in 2012 and it did not show any signs of L1-L2 disc to correlate with inguinal pain. 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  The patient also had hip MRI in 2012 which showed an obturator externus chronic intrasubstance tear no other significant abnormalities. HPI The patient has type 1 diabetes with insulin pump.  Peripheral neuropathy as well as autonomic neuropathy. Had recheck with urology, no issues with prostate, no inguinal hernia or scrotal abnormalities.  Currently going through pelvic floor physical therapy.  At one point he thought it was helping but he had some flareup of pain recently in the inguinal area as well as right testicular area. He also concerned about a bulge in his abdomen when doing sit ups.  No new bowel or bladder abnormalities.  No lower extremity  symptoms  Pain Inventory Average Pain 5 Pain Right Now 6 My pain is intermittent, sharp and aching  In the last 24 hours, has pain interfered with the following? General activity 4 Relation with others 2 Enjoyment of life 2 What TIME of day is your pain at its worst? daytime Sleep (in general) Poor  Pain is worse with: walking and standing Pain improves with: rest and therapy/exercise Relief from Meds: a little with Ibuprofen  Family History  Problem Relation Age of Onset  . Diabetes Father    Social History   Socioeconomic History  . Marital status: Married    Spouse name: Not on file  . Number of children: Not on file  . Years of education: Not on file  . Highest education level: Not on file  Occupational History  . Not on file  Tobacco Use  . Smoking status: Never Smoker  . Smokeless tobacco: Current User    Types: Snuff  Vaping Use  . Vaping Use: Never used  Substance and Sexual Activity  . Alcohol use: No  . Drug use: No  . Sexual activity: Not on file  Other Topics Concern  . Not on file  Social History Narrative  . Not on file   Social Determinants of Health   Financial Resource Strain: Not on file  Food Insecurity: Not on file  Transportation Needs: Not on file  Physical Activity: Not on file  Stress: Not on file  Social Connections:  Not on file   Past Surgical History:  Procedure Laterality Date  . CARDIAC CATHETERIZATION    . HERNIA REPAIR     Past Surgical History:  Procedure Laterality Date  . CARDIAC CATHETERIZATION    . HERNIA REPAIR     Past Medical History:  Diagnosis Date  . Anxiety   . Diabetes mellitus   . Dyslipidemia   . Hypothyroidism   . Low back pain    BP (!) 145/81   Pulse 88   Temp 98.6 F (37 C)   Ht 6\' 1"  (1.854 m)   Wt 226 lb 9.6 oz (102.8 kg)   SpO2 96%   BMI 29.90 kg/m   Opioid Risk Score:   Fall Risk Score:  `1  Depression screen PHQ 2/9  Depression screen [facility] 2/9 05/28/2020 03/31/2015 09/30/2014   Decreased Interest 1 0 0  Down, Depressed, Hopeless 1 0 0  PHQ - 2 Score 2 0 0  Altered sleeping - 2 -  Tired, decreased energy - 1 -  Change in appetite - 1 -  Feeling bad or failure about yourself  - 0 -  Trouble concentrating - 0 -  Moving slowly or fidgety/restless - 1 -  Suicidal thoughts - 0 -  PHQ-9 Score - 5 -   Review of Systems  Genitourinary:       Right groin & right hip  All other systems reviewed and are negative.      Objective:   Physical Exam Vitals reviewed.  Constitutional:      Appearance: He is obese.  HENT:     Head: Normocephalic and atraumatic.  Eyes:     Extraocular Movements: Extraocular movements intact.     Conjunctiva/sclera: Conjunctivae normal.  Genitourinary:    Penis: Normal.      Testes: Normal.     Comments: Inguinal area without bulges, mild tenderness along the right inguinal area no tenderness around the ASIS Skin:    General: Skin is dry.  Neurological:     Mental Status: He is alert and oriented to person, place, and time.     Comments: Normal strength in both lower extremities  Mild hypersensitivity to touch in the right inguinal area  Psychiatric:        Mood and Affect: Mood normal.        Behavior: Behavior normal.    Abdominal muscle small 2 cm gap just inferior to the xiphoid extending downward only 2 cm.  Nontender      Assessment & Plan:  #1.  Chronic recurrent right groin pain, patient has had negative work-up with general surgery and urology in terms of hernias.  CT of the abdomen showed no intra-abdominal process.  Distribution of pain appears to be most consistent with iliohypogastric nerve.  He may benefit from nerve block of this area and if temporary response may benefit from radiofrequency.  We will make referral to Dr. 10/02/2014 in [place] to evaluate. 2.  Mild upper abdominal diastases, continue abdominal strengthening will monitor  Patient will make an appointment with me after he finished treatment with  Dr. Derby

## 2020-05-29 ENCOUNTER — Telehealth: Payer: Self-pay

## 2020-05-29 NOTE — Telephone Encounter (Signed)
Connor Holt wanted to know if you refill his Tramadol & Robaxin? He will need coverage until he receive the nerve block. (No appointment has been set-up as of yet).  Call back phone [phone].

## 2020-06-01 MED ORDER — METHOCARBAMOL 500 MG PO TABS: ORAL_TABLET | ORAL | 0 refills | Status: DC

## 2020-06-01 MED ORDER — TRAMADOL HCL 50 MG PO TABS: 50.0000 mg | ORAL_TABLET | Freq: Three times a day (TID) | ORAL | 0 refills | Status: DC | PRN

## 2020-06-29 ENCOUNTER — Other Ambulatory Visit: Payer: Self-pay | Admitting: Physical Medicine & Rehabilitation

## 2020-07-28 ENCOUNTER — Other Ambulatory Visit: Payer: Self-pay | Admitting: Physical Medicine & Rehabilitation

## 2020-10-15 ENCOUNTER — Other Ambulatory Visit: Payer: Self-pay | Admitting: Physical Medicine & Rehabilitation

## 2020-12-09 ENCOUNTER — Other Ambulatory Visit: Payer: Self-pay | Admitting: Physical Medicine & Rehabilitation

## 2021-03-09 ENCOUNTER — Encounter: Payer: Medicare Other | Attending: Physical Medicine & Rehabilitation | Admitting: Physical Medicine & Rehabilitation

## 2021-03-09 ENCOUNTER — Other Ambulatory Visit: Payer: Self-pay

## 2021-03-09 ENCOUNTER — Encounter: Payer: Self-pay | Admitting: Physical Medicine & Rehabilitation

## 2021-03-09 VITALS — BP 143/87 | HR 86 | Temp 98.7°F | Ht 73.0 in | Wt 227.0 lb

## 2021-03-09 DIAGNOSIS — G5791 Unspecified mononeuropathy of right lower limb: Secondary | ICD-10-CM | POA: Diagnosis present

## 2021-03-09 NOTE — Progress Notes (Signed)
Subjective:    Patient ID: Connor Holt, male    DOB: 04-21-1965, 56 y.o.   MRN: [id number]  HPI 56 year old male with history of chronic right groin pain.  Onset of this was in 2012.  He has had work-up including lumbar MRI in 2012 which was unremarkable.  There was some facet arthropathy which was known from prior x-rays.  Right hip x-ray showed no significant DJD.  Right hip MRI demonstrated small intrasubstance tear of the obturator externus tendon.  The patient has undergone general surgery evaluation for inguinal hernia as well as urologic evaluation both of which were essentially normal. CT of the abdomen and pelvis on 02/25/2020 was unremarkable The patient is diabetic.  He has his groin pain that radiates into the testicles.  He also has some right buttock pain.  The patient states that his activity level has increased quite significantly.  He is back in the gym working out.  He has been doing some high intensity interval training exercise.  Because of increasing groin pain he has had to back off exercise program Pain Inventory Average Pain 5 Pain Right Now 4 My pain is sharp and aching  In the last 24 hours, has pain interfered with the following? General activity 3 Relation with others 1 Enjoyment of life 1 What TIME of day is your pain at its worst? morning , daytime, evening, and night Sleep (in general) NA  Pain is worse with: some activites Pain improves with:  na Relief from Meds:  no med  Family History  Problem Relation Age of Onset   Diabetes Father    Social History   Socioeconomic History   Marital status: Married    Spouse name: Not on file   Number of children: Not on file   Years of education: Not on file   Highest education level: Not on file  Occupational History   Not on file  Tobacco Use   Smoking status: Never   Smokeless tobacco: Current    Types: Snuff  Vaping Use   Vaping Use: Never used  Substance and Sexual Activity   Alcohol  use: No   Drug use: No   Sexual activity: Not on file  Other Topics Concern   Not on file  Social History Narrative   Not on file   Social Determinants of Health   Financial Resource Strain: Not on file  Food Insecurity: Not on file  Transportation Needs: Not on file  Physical Activity: Not on file  Stress: Not on file  Social Connections: Not on file   Past Surgical History:  Procedure Laterality Date   CARDIAC CATHETERIZATION     HERNIA REPAIR     Past Surgical History:  Procedure Laterality Date   CARDIAC CATHETERIZATION     HERNIA REPAIR     Past Medical History:  Diagnosis Date   Anxiety    Diabetes mellitus    Dyslipidemia    Hypothyroidism    Low back pain    BP (!) 143/87    Pulse 86    Temp 98.7 F (37.1 C)    Ht 6\' 1"  (1.854 m)    Wt 227 lb (103 kg)    SpO2 97%    BMI 29.95 kg/m   Opioid Risk Score:   Fall Risk Score:  `1  Depression screen PHQ 2/9  Depression screen [facility] 2/9 03/09/2021 05/28/2020 03/31/2015 09/30/2014  Decreased Interest 0 1 0 0  Down, Depressed, Hopeless 0 1 0 0  PHQ - 2 Score 0 2 0 0  Altered sleeping - - 2 -  Tired, decreased energy - - 1 -  Change in appetite - - 1 -  Feeling bad or failure about yourself  - - 0 -  Trouble concentrating - - 0 -  Moving slowly or fidgety/restless - - 1 -  Suicidal thoughts - - 0 -  PHQ-9 Score - - 5 -     Review of Systems  Constitutional: Negative.   HENT: Negative.    Eyes: Negative.   Respiratory: Negative.    Cardiovascular: Negative.   Gastrointestinal: Negative.   Endocrine: Negative.   Genitourinary: Negative.   Musculoskeletal:  Positive for back pain.       Groin pain  Skin: Negative.   Allergic/Immunologic: Negative.   Neurological: Negative.   Hematological: Negative.   Psychiatric/Behavioral: Negative.    All other systems reviewed and are negative.     Objective:   Physical Exam Vitals and nursing note reviewed.  Constitutional:      Appearance: He is normal  weight.  HENT:     Head: Normocephalic and atraumatic.  Eyes:     Extraocular Movements: Extraocular movements intact.     Conjunctiva/sclera: Conjunctivae normal.     Pupils: Pupils are equal, round, and reactive to light.  Musculoskeletal:     Right lower leg: No edema.     Left lower leg: No edema.     Comments: Faber's test on the right side refer some pain into the buttocks area. Negative thigh thrust test Normal hip range of motion with internal extra rotation. Mild tenderness over the inguinal crease but no masses palpated  Skin:    General: Skin is warm and dry.  Neurological:     Mental Status: He is alert and oriented to person, place, and time.     Comments: No numbness in the right inguinal area  Psychiatric:        Mood and Affect: Mood normal.        Behavior: Behavior normal.          Assessment & Plan:  1.  Acute exacerbation of chronic right groin pain.  Distribution fits ilioinguinal/iliohypogastric nerve.  He is diabetic with diabetic neuropathy this could represent a mononeuropathy in that area.  He feels like he may be developing similar symptoms on the left side. Made a referral for ilioinguinal/iliohypogastric nerve block, Dr. Lourdes Sledge, patient is quite anxious about the injection, he would like to discuss conscious sedation At this point since symptoms have not appreciably changed would not recommend additional imaging

## 2021-03-10 MED ORDER — METHOCARBAMOL 500 MG PO TABS
500.0000 mg | ORAL_TABLET | Freq: Three times a day (TID) | ORAL | 0 refills | Status: DC | PRN
Start: 2021-03-10 — End: 2021-11-05

## 2021-03-10 MED ORDER — TRAMADOL HCL 50 MG PO TABS: ORAL_TABLET | ORAL | 1 refills | Status: DC

## 2021-03-10 NOTE — Telephone Encounter (Signed)
Tramadol Rx was on phone in instead of normal so did not transmit to pharmacy. I have phoned in the order and notified Mr. Lanuza. ?

## 2021-03-19 ENCOUNTER — Ambulatory Visit: Payer: Medicare Other | Admitting: Physical Medicine & Rehabilitation

## 2021-04-07 ENCOUNTER — Encounter: Payer: Self-pay | Admitting: Physical Medicine & Rehabilitation

## 2021-04-07 DIAGNOSIS — G8929 Other chronic pain: Secondary | ICD-10-CM

## 2021-04-07 DIAGNOSIS — R1031 Right lower quadrant pain: Secondary | ICD-10-CM

## 2021-04-07 DIAGNOSIS — G5791 Unspecified mononeuropathy of right lower limb: Secondary | ICD-10-CM

## 2021-04-29 ENCOUNTER — Encounter: Payer: Self-pay | Admitting: *Deleted

## 2021-04-29 NOTE — Telephone Encounter (Signed)
Opened in error

## 2021-05-03 ENCOUNTER — Other Ambulatory Visit: Payer: Self-pay | Admitting: Physical Medicine & Rehabilitation

## 2021-06-03 ENCOUNTER — Encounter: Payer: Medicare Other | Attending: Physical Medicine & Rehabilitation | Admitting: Physical Medicine & Rehabilitation

## 2021-06-03 ENCOUNTER — Encounter: Payer: Self-pay | Admitting: Physical Medicine & Rehabilitation

## 2021-06-03 VITALS — BP 144/88 | HR 92 | Ht 72.0 in | Wt 225.0 lb

## 2021-06-03 DIAGNOSIS — M5416 Radiculopathy, lumbar region: Secondary | ICD-10-CM | POA: Insufficient documentation

## 2021-06-03 DIAGNOSIS — G5791 Unspecified mononeuropathy of right lower limb: Secondary | ICD-10-CM | POA: Diagnosis present

## 2021-06-03 NOTE — Progress Notes (Signed)
Subjective:    Patient ID: Connor Holt, male    DOB: October 22, 1965, 56 y.o.   MRN: [id number]  HPI  56 year old male with history of chronic low back pain, intermittent right groin pain as well as intermittent scrotal pain on the right side.  He has had urologic evaluation which was normal, general surgery checked him for hernia and there was no evidence.  He had previously been evaluated by sports medicine.  Lumbar MRI in 2012 did not show any significant compressive lesions just some lumbar spondylosis.  Hip MRI in 2012 demonstrated obturator externus tendinitis. 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  No prior history of pelvic injury.  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 He continues to lift weights for the upper body does not do much with lower body strengthening exercise at the current time. He states that his symptoms can most reliably be reproduced when he plays golf and is only able to get through about 2 holes before he needs to stop playing.  He feels like the twisting motion is the worst. Pain Inventory Average Pain 3 Pain Right Now 0 My pain is intermittent, sharp, stabbing, and aching  In the last 24 hours, has pain interfered with the following? General activity 4 Relation with others 2 Enjoyment of life 2 What TIME of day is your pain at its worst? varies Sleep (in general) Fair  Pain is worse with: walking and some activites Pain improves with: rest, therapy/exercise, and medication Relief from Meds: 3  Family History  Problem Relation Age of Onset   Diabetes Father    Social History   Socioeconomic History   Marital status: Married    Spouse name: Not on file   Number of children: Not on file   Years of education: Not on file   Highest education level: Not on file  Occupational History   Not on file  Tobacco Use   Smoking status: Never   Smokeless tobacco: Current    Types: Snuff  Vaping Use   Vaping Use:  Never used  Substance and Sexual Activity   Alcohol use: No   Drug use: No   Sexual activity: Not on file  Other Topics Concern   Not on file  Social History Narrative   Not on file   Social Determinants of Health   Financial Resource Strain: Not on file  Food Insecurity: Not on file  Transportation Needs: Not on file  Physical Activity: Not on file  Stress: Not on file  Social Connections: Not on file   Past Surgical History:  Procedure Laterality Date   CARDIAC CATHETERIZATION     HERNIA REPAIR     Past Surgical History:  Procedure Laterality Date   CARDIAC CATHETERIZATION     HERNIA REPAIR     Past Medical History:  Diagnosis Date   Anxiety    Diabetes mellitus    Dyslipidemia    Hypothyroidism    Low back pain    BP (!) 144/88   Pulse 92   Ht 6' (1.829 m)   Wt 225 lb (102.1 kg)   SpO2 97%   BMI 30.52 kg/m   Opioid Risk Score:   Fall Risk Score:  `1  Depression screen [facility] 2/9     03/09/2021    3:26 PM 05/28/2020    1:07 PM 03/31/2015    9:57 AM 09/30/2014    3:20 PM  Depression screen PHQ 2/9  Decreased Interest 0  1 0 0  Down, Depressed, Hopeless 0 1 0 0  PHQ - 2 Score 0 2 0 0  Altered sleeping   2   Tired, decreased energy   1   Change in appetite   1   Feeling bad or failure about yourself    0   Trouble concentrating   0   Moving slowly or fidgety/restless   1   Suicidal thoughts   0   PHQ-9 Score   5       Review of Systems  All other systems reviewed and are negative.     Objective:   Physical Exam Vitals and nursing note reviewed.  Constitutional:      Appearance: He is normal weight.  HENT:     Head: Normocephalic and atraumatic.  Eyes:     Extraocular Movements: Extraocular movements intact.     Conjunctiva/sclera: Conjunctivae normal.     Pupils: Pupils are equal, round, and reactive to light.  Musculoskeletal:        General: No tenderness.     Right lower leg: No edema.     Left lower leg: No edema.     Comments:  There is mild tenderness to palpation in the upper lumbar area Negative straight leg raising bilaterally Sensation intact light touch bilateral L1-L2-L3 L4 distribution. Patient has normal lumbar range of motion with flexion extension there is pain with right lateral bending along the upper lumbar area.  Skin:    General: Skin is warm and dry.  Neurological:     General: No focal deficit present.     Mental Status: He is alert and oriented to person, place, and time.  Psychiatric:        Mood and Affect: Mood normal.        Behavior: Behavior normal.    Ambulates without assistive device no evidence of toe drag or knee instability No pain with hip range of motion      Assessment & Plan:  1.  Chronic groin pain that radiates into the scrotum.  Exacerbating factors include twisting.  Has had previous work-up about 10 years ago but has had recurrence of symptoms.  Appears to be similar to prior.  I do think he would be a good candidate for ilioinguinal/iliohypogastric nerve block as a diagnostic/therapeutic tool. We will repeat MRI of the lumbar spine specifically to evaluate for right L1 radiculopathy.

## 2021-06-03 NOTE — Patient Instructions (Signed)
Will contact you if there is evidence of lumbar spinal nerve pinched

## 2021-06-17 ENCOUNTER — Ambulatory Visit
Admission: RE | Admit: 2021-06-17 | Discharge: 2021-06-17 | Disposition: A | Discharge status: Home / Self Care | Payer: Medicare Other | Source: Ambulatory Visit | Attending: Physical Medicine & Rehabilitation | Admitting: Physical Medicine & Rehabilitation

## 2021-06-17 DIAGNOSIS — M5416 Radiculopathy, lumbar region: Secondary | ICD-10-CM

## 2021-06-20 DIAGNOSIS — E1142 Type 2 diabetes mellitus with diabetic polyneuropathy: Secondary | ICD-10-CM | POA: Insufficient documentation

## 2021-06-20 DIAGNOSIS — F411 Generalized anxiety disorder: Secondary | ICD-10-CM | POA: Insufficient documentation

## 2021-06-20 DIAGNOSIS — IMO0002 Reserved for concepts with insufficient information to code with codable children: Secondary | ICD-10-CM | POA: Insufficient documentation

## 2021-06-20 DIAGNOSIS — M729 Fibroblastic disorder, unspecified: Secondary | ICD-10-CM | POA: Insufficient documentation

## 2021-06-20 DIAGNOSIS — K036 Deposits [accretions] on teeth: Secondary | ICD-10-CM | POA: Insufficient documentation

## 2021-06-20 DIAGNOSIS — M5137 Other intervertebral disc degeneration, lumbosacral region: Secondary | ICD-10-CM | POA: Insufficient documentation

## 2021-06-20 DIAGNOSIS — Z7189 Other specified counseling: Secondary | ICD-10-CM | POA: Insufficient documentation

## 2021-06-20 DIAGNOSIS — G4730 Sleep apnea, unspecified: Secondary | ICD-10-CM | POA: Insufficient documentation

## 2021-06-20 DIAGNOSIS — E109 Type 1 diabetes mellitus without complications: Secondary | ICD-10-CM | POA: Insufficient documentation

## 2021-06-20 DIAGNOSIS — K0262 Dental caries on smooth surface penetrating into dentin: Secondary | ICD-10-CM | POA: Insufficient documentation

## 2021-06-20 DIAGNOSIS — H5200 Hypermetropia, unspecified eye: Secondary | ICD-10-CM | POA: Insufficient documentation

## 2021-06-20 DIAGNOSIS — E785 Hyperlipidemia, unspecified: Secondary | ICD-10-CM | POA: Insufficient documentation

## 2021-06-20 DIAGNOSIS — M51379 Other intervertebral disc degeneration, lumbosacral region without mention of lumbar back pain or lower extremity pain: Secondary | ICD-10-CM | POA: Insufficient documentation

## 2021-06-20 DIAGNOSIS — U071 COVID-19: Secondary | ICD-10-CM | POA: Insufficient documentation

## 2021-06-20 DIAGNOSIS — R42 Dizziness and giddiness: Secondary | ICD-10-CM | POA: Insufficient documentation

## 2021-06-20 DIAGNOSIS — E1040 Type 1 diabetes mellitus with diabetic neuropathy, unspecified: Secondary | ICD-10-CM | POA: Insufficient documentation

## 2021-06-20 DIAGNOSIS — L02415 Cutaneous abscess of right lower limb: Secondary | ICD-10-CM | POA: Insufficient documentation

## 2021-06-20 DIAGNOSIS — H524 Presbyopia: Secondary | ICD-10-CM | POA: Insufficient documentation

## 2021-06-20 DIAGNOSIS — G4733 Obstructive sleep apnea (adult) (pediatric): Secondary | ICD-10-CM | POA: Insufficient documentation

## 2021-06-20 DIAGNOSIS — M25579 Pain in unspecified ankle and joints of unspecified foot: Secondary | ICD-10-CM | POA: Insufficient documentation

## 2021-06-20 DIAGNOSIS — F331 Major depressive disorder, recurrent, moderate: Secondary | ICD-10-CM | POA: Insufficient documentation

## 2021-06-20 DIAGNOSIS — R2241 Localized swelling, mass and lump, right lower limb: Secondary | ICD-10-CM | POA: Insufficient documentation

## 2021-06-20 DIAGNOSIS — E10649 Type 1 diabetes mellitus with hypoglycemia without coma: Secondary | ICD-10-CM | POA: Insufficient documentation

## 2021-06-20 DIAGNOSIS — K0381 Cracked tooth: Secondary | ICD-10-CM | POA: Insufficient documentation

## 2021-06-20 DIAGNOSIS — E1140 Type 2 diabetes mellitus with diabetic neuropathy, unspecified: Secondary | ICD-10-CM | POA: Insufficient documentation

## 2021-06-20 DIAGNOSIS — L708 Other acne: Secondary | ICD-10-CM | POA: Insufficient documentation

## 2021-06-20 DIAGNOSIS — Z135 Encounter for screening for eye and ear disorders: Secondary | ICD-10-CM | POA: Insufficient documentation

## 2021-06-20 DIAGNOSIS — M545 Low back pain, unspecified: Secondary | ICD-10-CM | POA: Insufficient documentation

## 2021-06-20 DIAGNOSIS — Z713 Dietary counseling and surveillance: Secondary | ICD-10-CM | POA: Insufficient documentation

## 2021-06-20 NOTE — Progress Notes (Deleted)
Patient: Connor Holt  Service Category: E/M  Provider: Gaspar Cola, MD  DOB: May 03, 1965  DOS: 06/23/2021  Referring Provider: Charlett Blake, MD  MRN: [id number]  Setting: Ambulatory outpatient  PCP: Patient, No Pcp Per (Inactive)  Type: New Patient  Specialty: Interventional Pain Management    Location: Office  Delivery: Face-to-face     Primary Reason(s) for Visit: Encounter for initial evaluation of one or more chronic problems (new to examiner) potentially causing chronic pain, and posing a threat to normal musculoskeletal function. (Level of risk: High) CC: No chief complaint on file.  HPI  Connor Holt is a 56 y.o. year old, male patient, who comes for the first time to our practice referred by Kirsteins, Luanna Salk, MD for our initial evaluation of his chronic pain. He has HYPOTHYROIDISM; DIABETES MELLITUS, TYPE I; VITAMIN B12 DEFICIENCY; VITAMIN D DEFICIENCY; HYPERCHOLESTEROLEMIA; ANXIETY; POLYNEUROPATHY; SINUSITIS- ACUTE-NOS; ALLERGIC RHINITIS; ERECTILE DYSFUNCTION, ORGANIC; SKIN LESION; FATIGUE; Cough; RASH-NONVESICULAR; INGUINAL PAIN, RIGHT; Lumbosacral spondylosis without myelopathy; Lumbar spondylosis; Wheezing; Pelvic floor dysfunction; Acromioclavicular joint arthritis; Encounter for orthopedic follow-up care; Hyperlipidemia; Hypoglycemia due to type 1 diabetes mellitus ([place]); Lateral epicondylitis of right elbow; Low back pain; Osteoarthritis of carpometacarpal (CMC) joint of thumb; Pain in thumb joint with movement; Pain in joint of right elbow; Pain in joint of right shoulder; Stiffness of shoulder joint, right; Carpal tunnel syndrome of right wrist; COVID-19; Cracked tooth; Cutaneous abscess of right lower limb; Localized swelling, mass and lump, right lower limb; Dental caries on smooth surface penetrating into dentin; Deposits (accretions) on teeth; Dizziness; Encounter for screening for eye and ear disorders; Dietary counseling and surveillance; Other specified  counseling; Fasciitis; Generalized anxiety disorder; Hypermetropia; Hypertensive disorder; Major depressive disorder, recurrent, moderate ([place]); Recurrent major depressive episodes, moderate ([place]); Sleep apnea; Pain in joint, ankle and foot; Pain in left knee; Pneumonia due to COVID-19 virus; Polyneuropathy in diabetes ([place]); Presbyopia; Thoracic or lumbosacral neuritis or radiculitis; Trigger thumb of right hand; Type 2 diabetes mellitus with diabetic neuropathy, unspecified ([place]); Degeneration of lumbar or lumbosacral intervertebral disc; Other acne; Type I diabetes mellitus ([place]); Diabetes mellitus ([place]); Type 1 diabetes mellitus with diabetic neuropathy, unspecified ([place]); Type 1 diabetes mellitus without complications ([place]); Hypothyroidism; Vitamin D deficiency; Hyperlipidemia, unspecified; Obstructive sleep apnea (adult) (pediatric); Peripheral neuropathy; Chronic pain syndrome; Pharmacologic therapy; Disorder of skeletal system; and Problems influencing health status on their problem list. Today he comes in for evaluation of his No chief complaint on file.  Pain Assessment: Location:     Radiating:   Onset:   Duration:   Quality:   Severity:  /10 (subjective, self-reported pain score)  Effect on ADL:   Timing:   Modifying factors:   BP:    HR:    Onset and Duration: {Hx; Onset and Duration:210120511} Cause of pain: {Hx; Cause:210120521} Severity: {Pain Severity:210120502} Timing: {Symptoms; Timing:210120501} Aggravating Factors: {Causes; Aggravating pain factors:210120507} Alleviating Factors: {Causes; Alleviating Factors:210120500} Associated Problems: {Hx; Associated problems:210120515} Quality of Pain: {Hx; Symptom quality or Descriptor:210120531} Previous Examinations or Tests: {Hx; Previous examinations or test:210120529} Previous Treatments: {Hx; Previous Treatment:210120503}  ***  Today I took the time to provide the patient with information regarding my pain practice. The  patient was informed that my practice is divided into two sections: an interventional pain management section, as well as a completely separate and distinct medication management section. I explained that I have procedure days for my interventional therapies, and evaluation days for follow-ups and medication management. Because of the amount of documentation required  during both, they are kept separated. This means that there is the possibility that he may be scheduled for a procedure on one day, and medication management the next. I have also informed him that because of staffing and facility limitations, I no longer take patients for medication management only. To illustrate the reasons for this, I gave the patient the example of surgeons, and how inappropriate it would be to refer a patient to his/her care, just to write for the post-surgical antibiotics on a surgery done by a different surgeon.   Because interventional pain management is my board-certified specialty, the patient was informed that joining my practice means that they are open to any and all interventional therapies. I made it clear that this does not mean that they will be forced to have any procedures done. What this means is that I believe interventional therapies to be essential part of the diagnosis and proper management of chronic pain conditions. Therefore, patients not interested in these interventional alternatives will be better served under the care of a different practitioner.  The patient was also made aware of my Comprehensive Pain Management Safety Guidelines where by joining my practice, they limit all of their nerve blocks and joint injections to those done by our practice, for as long as we are retained to manage their care.   Historic Controlled Substance Pharmacotherapy Review  PMP and historical list of controlled substances: ***  Current opioid analgesics:   *** MME/day: *** mg/day  Historical Monitoring: The  patient  reports no history of drug use. List of all UDS Test(s): Lab Results  Component Value Date   MDMA NEGATIVE 01/30/2011   COCAINSCRNUR NEGATIVE 01/30/2011   PCPSCRNUR NEGATIVE 01/30/2011   THCU NEGATIVE 01/30/2011   List of other Serum/Urine Drug Screening Test(s):  Lab Results  Component Value Date   COCAINSCRNUR NEGATIVE 01/30/2011   THCU NEGATIVE 01/30/2011   Historical Background Evaluation: [place] PMP: PDMP reviewed during this encounter. Online review of the past 87-month period conducted.             PMP NARX Score Report:  Narcotic: *** Sedative: *** Stimulant: *** [place] Department of public safety, offender search: Engineer, mining Information) Non-contributory Risk Assessment Profile: Aberrant behavior: None observed or detected today Risk factors for fatal opioid overdose: None identified today PMP NARX Overdose Risk Score: *** Fatal overdose hazard ratio (HR): Calculation deferred Non-fatal overdose hazard ratio (HR): Calculation deferred Risk of opioid abuse or dependence: 0.7-3.0% with doses ? 36 MME/day and 6.1-26% with doses ? 120 MME/day. Substance use disorder (SUD) risk level: See below Personal History of Substance Abuse (SUD-Substance use disorder):  Alcohol:    Illegal Drugs:    Rx Drugs:    ORT Risk Level calculation:    ORT Scoring interpretation table:  Score <3 = Low Risk for SUD  Score between 4-7 = Moderate Risk for SUD  Score >8 = High Risk for Opioid Abuse   PHQ-2 Depression Scale:  Total score:    PHQ-2 Scoring interpretation table: (Score and probability of major depressive disorder)  Score 0 = No depression  Score 1 = 15.4% Probability  Score 2 = 21.1% Probability  Score 3 = 38.4% Probability  Score 4 = 45.5% Probability  Score 5 = 56.4% Probability  Score 6 = 78.6% Probability   PHQ-9 Depression Scale:  Total score:    PHQ-9 Scoring interpretation table:  Score 0-4 = No depression  Score 5-9 = Mild depression  Score 10-14 = Moderate  depression  Score 15-19 = Moderately severe depression  Score 20-27 = Severe depression (2.4 times higher risk of SUD and 2.89 times higher risk of overuse)   Pharmacologic Plan: As per protocol, I have not taken over any controlled substance management, pending the results of ordered tests and/or consults.            Initial impression: Pending review of available data and ordered tests.  Meds   Current Outpatient Medications:    acetaminophen (TYLENOL) 500 MG tablet, Take by mouth., Disp: , Rfl:    atorvastatin (LIPITOR) 80 MG tablet, TAKE ONE TABLET BY MOUTH AT BEDTIME FOR CHOLESTEROL, Disp: , Rfl:    cetirizine (ZYRTEC) 10 MG tablet, Take by mouth., Disp: , Rfl:    Cholecalciferol 50 MCG (2000 UT) TABS, Take 1 tablet by mouth daily., Disp: , Rfl:    clobetasol (TEMOVATE) 0.05 % external solution, Apply topically., Disp: , Rfl:    DULoxetine (CYMBALTA) 30 MG capsule, TAKE ONE CAPSULE BY MOUTH ONCE A DAY WITH SUPPER FOR MOOD AND ANXIETY, Disp: , Rfl:    gabapentin (NEURONTIN) 600 MG tablet, Take 1 tablet (600 mg total) by mouth 3 (three) times daily., Disp: 90 tablet, Rfl: 2   glucagon (GLUCAGEN HYPOKIT) 1 MG SOLR injection, INJECT 1 UNIT($RemoveBefor'1MG'[id number]$ ) SUBCUTANEOUSLY  AS NEEDED FOR LOW BLOOD GLUCOSE, Disp: , Rfl:    Glucose 15 GM/32ML GEL, TAKE ONE TUBE BY MOUTH AS NEEDED, Disp: , Rfl:    GLUTOSE 15 40 % GEL, Take by mouth., Disp: , Rfl:    ibuprofen (ADVIL) 800 MG tablet, Take 800 mg by mouth 3 (three) times daily., Disp: , Rfl:    insulin aspart (NOVOLOG) 100 UNIT/ML FlexPen, Inject into the skin., Disp: , Rfl:    insulin aspart (NOVOLOG) 100 UNIT/ML injection, INJECT PROGRAMMABLE PUMP VIA PUMP CONTINUOUS VIA PUMP FOR INFUSION THRU PATIENT'S PUMP (FOR DIABETES) 100 UNITS PER DAY, Disp: , Rfl:    Insulin Human (INSULIN PUMP) SOLN, Inject 0-100 each into the skin continuous. Varies with intake, meals, and exercise., Disp: , Rfl:    ketoconazole (NIZORAL) 2 % shampoo, ketoconazole 2 % shampoo, Disp:  , Rfl:    levothyroxine (SYNTHROID) 150 MCG tablet, Take 1 tablet by mouth daily., Disp: , Rfl:    losartan (COZAAR) 50 MG tablet, TAKE ONE-HALF TABLET BY MOUTH ONCE A DAY ** NOTE DOSE CHANGE, Disp: , Rfl:    methocarbamol (ROBAXIN) 500 MG tablet, Take 1 tablet (500 mg total) by mouth every 8 (eight) hours as needed for muscle spasms. Every 8 hours as needed for Muscle Spasms, Disp: 30 tablet, Rfl: 0   ONE TOUCH ULTRA TEST test strip, TEST BLOOD SUGAR 8 OR 9 TIMES DAILY AS DIRECTED, Disp: 50 each, Rfl: 0   traMADol (ULTRAM) 50 MG tablet, TAKE 1 TABLET BY MOUTH THREE TIMES DAILY, Disp: 90 tablet, Rfl: 2   vitamin B-12 (CYANOCOBALAMIN) 100 MCG tablet, Take 100 mcg by mouth daily., Disp: , Rfl:   Imaging Review  Cervical Imaging: Cervical MR wo contrast: No results found for this or any previous visit.  Cervical MR wo contrast: No valid procedures specified. Cervical MR w/wo contrast: No results found for this or any previous visit.  Cervical MR w contrast: No results found for this or any previous visit.  Cervical CT wo contrast: No results found for this or any previous visit.  Cervical CT w/wo contrast: No results found for this or any previous visit.  Cervical CT w/wo contrast: No results found  for this or any previous visit.  Cervical CT w contrast: No results found for this or any previous visit.  Cervical CT outside: No results found for this or any previous visit.  Cervical DG 1 view: No results found for this or any previous visit.  Cervical DG 2-3 views: No results found for this or any previous visit.  Cervical DG F/E views: No results found for this or any previous visit.  Cervical DG 2-3 clearing views: No results found for this or any previous visit.  Cervical DG Bending/F/E views: No results found for this or any previous visit.  Cervical DG complete: No results found for this or any previous visit.  Cervical DG Myelogram views: No results found for this or any  previous visit.  Cervical DG Myelogram views: No results found for this or any previous visit.  Cervical Discogram views: No results found for this or any previous visit.   Shoulder Imaging: Shoulder-R MR w contrast: No results found for this or any previous visit.  Shoulder-L MR w contrast: No results found for this or any previous visit.  Shoulder-R MR w/wo contrast: No results found for this or any previous visit.  Shoulder-L MR w/wo contrast: No results found for this or any previous visit.  Shoulder-R MR wo contrast: No results found for this or any previous visit.  Shoulder-L MR wo contrast: No results found for this or any previous visit.  Shoulder-R CT w contrast: No results found for this or any previous visit.  Shoulder-L CT w contrast: No results found for this or any previous visit.  Shoulder-R CT w/wo contrast: No results found for this or any previous visit.  Shoulder-L CT w/wo contrast: No results found for this or any previous visit.  Shoulder-R CT wo contrast: No results found for this or any previous visit.  Shoulder-L CT wo contrast: No results found for this or any previous visit.  Shoulder-R DG Arthrogram: No results found for this or any previous visit.  Shoulder-L DG Arthrogram: No results found for this or any previous visit.  Shoulder-R DG 1 view: No results found for this or any previous visit.  Shoulder-L DG 1 view: No results found for this or any previous visit.  Shoulder-R DG: No results found for this or any previous visit.  Shoulder-L DG: No results found for this or any previous visit.   Thoracic Imaging: Thoracic MR wo contrast: No results found for this or any previous visit.  Thoracic MR wo contrast: No valid procedures specified. Thoracic MR w/wo contrast: No results found for this or any previous visit.  Thoracic MR w contrast: No results found for this or any previous visit.  Thoracic CT wo contrast: No results found for this  or any previous visit.  Thoracic CT w/wo contrast: No results found for this or any previous visit.  Thoracic CT w/wo contrast: No results found for this or any previous visit.  Thoracic CT w contrast: No results found for this or any previous visit.  Thoracic DG 2-3 views: No results found for this or any previous visit.  Thoracic DG 4 views: No results found for this or any previous visit.  Thoracic DG: No results found for this or any previous visit.  Thoracic DG w/swimmers view: No results found for this or any previous visit.  Thoracic DG Myelogram views: No results found for this or any previous visit.  Thoracic DG Myelogram views: No results found for this or any previous visit.  Lumbosacral Imaging: Lumbar MR wo contrast: Results for orders placed during the hospital encounter of 06/17/21  MR LUMBAR SPINE WO CONTRAST  Narrative CLINICAL DATA:  Right hip and leg pain.  EXAM: MRI LUMBAR SPINE WITHOUT CONTRAST  TECHNIQUE: Multiplanar, multisequence MR imaging of the lumbar spine was performed. No intravenous contrast was administered.  COMPARISON:  03/15/2010  FINDINGS: Segmentation:  Standard.  Alignment: Minimal grade 1 anterolisthesis of L4 on L5 secondary to facet disease.  Vertebrae: No acute fracture, evidence of discitis, or aggressive bone lesion.  Conus medullaris and cauda equina: Conus extends to the L1 level. Conus and cauda equina appear normal.  Paraspinal and other soft tissues: No acute paraspinal abnormality.  Disc levels:  Disc spaces: Minimal disc height loss at L2-3, L3-4 and L4-5.  T12-L1: No significant disc bulge. No neural foraminal stenosis. No central canal stenosis.  L1-L2: No significant disc bulge. No neural foraminal stenosis. No central canal stenosis.  L2-L3: Mild broad-based disc bulge. Mild bilateral facet arthropathy. No foraminal or central canal stenosis.  L3-L4: Mild broad-based disc bulge. Mild bilateral  facet arthropathy. No foraminal or central canal stenosis.  L4-L5: Mild broad-based disc bulge. Moderate bilateral facet arthropathy with a left facet effusion. Mild bilateral foraminal narrowing.  L5-S1: No significant disc bulge. No neural foraminal stenosis. No central canal stenosis. Moderate bilateral facet arthropathy, right worse than left.  IMPRESSION: 1. Mild lumbar spine spondylosis as described above. 2. No acute osseous injury of the lumbar spine.   Electronically Signed By: Kathreen Devoid M.D. On: 06/19/2021 16:48  Lumbar MR wo contrast: No valid procedures specified. Lumbar MR w/wo contrast: No results found for this or any previous visit.  Lumbar MR w/wo contrast: No results found for this or any previous visit.  Lumbar MR w contrast: No results found for this or any previous visit.  Lumbar CT wo contrast: No results found for this or any previous visit.  Lumbar CT w/wo contrast: No results found for this or any previous visit.  Lumbar CT w/wo contrast: No results found for this or any previous visit.  Lumbar CT w contrast: No results found for this or any previous visit.  Lumbar DG 1V: No results found for this or any previous visit.  Lumbar DG 1V (Clearing): No results found for this or any previous visit.  Lumbar DG 2-3V (Clearing): No results found for this or any previous visit.  Lumbar DG 2-3 views: No results found for this or any previous visit.  Lumbar DG (Complete) 4+V: No results found for this or any previous visit.        Lumbar DG F/E views: No results found for this or any previous visit.        Lumbar DG Bending views: Results for orders placed in visit on 04/06/02  DG Lumbar Spine Complete W/Bend  Narrative FINDINGS CLINICAL DATA:  BACK PAIN WITH RIGHT RADICULAR PAIN. LUMBAR SPINE RADIOGRAPHS, SIX VIEWS, LUMBAR SPINE WITH FLEXION AND EXTENSION: THERE ARE FIVE LUMBAR TYPE VERTEBRAL BODIES.  DISC SPACE HEIGHTS ARE WITHIN NORMAL  LIMITS.  THERE ARE THE MOST MINIMAL FACET DEGENERATIVE CHANGES AT L4-5 AND L5-S1. WITH FLEXION AND EXTENSION, THE PATIENT DOES NOT GENERATE A GREAT DEAL OF BENDING, BUT NO ABNORMAL MOTION IS DETECTED. IMPRESSION MINIMAL FACET DEGENERATIVE CHANGES IN THE LOWER LUMBAR SPINE.        Lumbar DG Myelogram views: No results found for this or any previous visit.  Lumbar DG Myelogram: No results found for this or any  previous visit.  Lumbar DG Myelogram: No results found for this or any previous visit.  Lumbar DG Myelogram: No results found for this or any previous visit.  Lumbar DG Myelogram Lumbosacral: No results found for this or any previous visit.  Lumbar DG Diskogram views: No results found for this or any previous visit.  Lumbar DG Diskogram views: No results found for this or any previous visit.  Lumbar DG Epidurogram OP: No results found for this or any previous visit.  Lumbar DG Epidurogram IP: No valid procedures specified.  Sacroiliac Joint Imaging: Sacroiliac Joint DG: No results found for this or any previous visit.  Sacroiliac Joint MR w/wo contrast: No results found for this or any previous visit.  Sacroiliac Joint MR wo contrast: No results found for this or any previous visit.   Spine Imaging: Whole Spine DG Myelogram views: No results found for this or any previous visit.  Whole Spine MR Mets screen: No results found for this or any previous visit.  Whole Spine MR Mets screen: No results found for this or any previous visit.  Whole Spine MR w/wo: No results found for this or any previous visit.  MRA Spinal Canal w/ cm: No results found for this or any previous visit.  MRA Spinal Canal wo/ cm: No valid procedures specified. MRA Spinal Canal w/wo cm: No results found for this or any previous visit.  Spine Outside MR Films: No results found for this or any previous visit.  Spine Outside CT Films: No results found for this or any previous visit.  CT-Guided  Biopsy: No results found for this or any previous visit.  CT-Guided Needle Placement: No results found for this or any previous visit.  DG Spine outside: No results found for this or any previous visit.  IR Spine outside: No results found for this or any previous visit.  NM Spine outside: No results found for this or any previous visit.   Hip Imaging: Hip-R MR w contrast: No results found for this or any previous visit.  Hip-L MR w contrast: No results found for this or any previous visit.  Hip-R MR w/wo contrast: No results found for this or any previous visit.  Hip-L MR w/wo contrast: No results found for this or any previous visit.  Hip-R MR wo contrast: Results for orders placed during the hospital encounter of 05/24/10  MR Hip Right Wo Contrast  Narrative *RADIOLOGY REPORT*  Clinical Data: Right groin and hip and right upper leg pain for 4 months.  The  MRI OF THE RIGHT HIP WITHOUT CONTRAST  Technique:  Multiplanar, multisequence MR imaging was performed. No intravenous contrast was administered.  Comparison: None.  Findings: There is abnormal edema in the right external obturator muscle extending to the origin at the right side of the symphysis pubis.  There is edema in the right side of the symphysis pubis with a small erosion of the right side of the symphysis.  I suspect this abnormality represents a partial tear of the obturator externus muscle with a secondary stress injury at the symphysis pubis.  There is no discrete mass.  The muscle is not enlarged.  The other osseous structures are normal. There is no hip effusion or bursitis.  The other muscle structures around the hips are normal.  There is no adenopathy.  There is a small left inguinal hernia containing only fat.  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  Original Report Authenticated By: Larey Seat, M.D.  Hip-L MR wo contrast: No results found for this or any previous visit.  Hip-R CT w contrast: No results found for this or any previous visit.  Hip-L CT w contrast: No results found for this or any previous visit.  Hip-R CT w/wo contrast: No results found for this or any previous visit.  Hip-L CT w/wo contrast: No results found for this or any previous visit.  Hip-R CT wo contrast: No results found for this or any previous visit.  Hip-L CT wo contrast: No results found for this or any previous visit.  Hip-R DG 2-3 views: No results found for this or any previous visit.  Hip-L DG 2-3 views: No results found for this or any previous visit.  Hip-R DG Arthrogram: No results found for this or any previous visit.  Hip-L DG Arthrogram: No results found for this or any previous visit.  Hip-B DG Bilateral: No results found for this or any previous visit.   Knee Imaging: Knee-R MR w contrast: No results found for this or any previous visit.  Knee-L MR w/o contrast: No results found for this or any previous visit.  Knee-R MR w/wo contrast: No results found for this or any previous visit.  Knee-L MR w/wo contrast: No results found for this or any previous visit.  Knee-R MR wo contrast: No results found for this or any previous visit.  Knee-L MR wo contrast: No results found for this or any previous visit.  Knee-R CT w contrast: No results found for this or any previous visit.  Knee-L CT w contrast: No results found for this or any previous visit.  Knee-R CT w/wo contrast: No results found for this or any previous visit.  Knee-L CT w/wo contrast: No results found for this or any previous visit.  Knee-R CT wo contrast: No results found for this or any previous visit.  Knee-L CT wo contrast: No results found for this or any previous visit.  Knee-R DG 1-2 views: No results found for this or any previous  visit.  Knee-L DG 1-2 views: No results found for this or any previous visit.  Knee-R DG 3 views: No results found for this or any previous visit.  Knee-L DG 3 views: No results found for this or any previous visit.  Knee-R DG 4 views: No results found for this or any previous visit.  Knee-L DG 4 views: No results found for this or any previous visit.  Knee-R DG Arthrogram: No results found for this or any previous visit.  Knee-L DG Arthrogram: No results found for this or any previous visit.   Ankle Imaging: Ankle-R DG Complete: No results found for this or any previous visit.  Ankle-L DG Complete: No results found for this or any previous visit.   Foot Imaging: Foot-R DG Complete: No results found for this or any previous visit.  Foot-L DG Complete: No results found for this or any previous visit.   Elbow Imaging: Elbow-R DG Complete: No results found for this or any previous visit.  Elbow-L DG Complete: No results found for this or any previous visit.   Wrist Imaging: Wrist-R DG Complete: No results found for this or any previous visit.  Wrist-L DG Complete: No results found for this or any previous visit.   Hand Imaging: Hand-R DG Complete: No results found for this or any previous visit.  Hand-L  DG Complete: No results found for this or any previous visit.   Complexity Note: Imaging results reviewed. Results shared with Connor Holt, using Layman's terms.                         ROS  Cardiovascular: {Hx; Cardiovascular History:210120525} Pulmonary or Respiratory: {Hx; Pumonary and/or Respiratory History:210120523} Neurological: {Hx; Neurological:210120504} Psychological-Psychiatric: {Hx; Psychological-Psychiatric History:210120512} Gastrointestinal: {Hx; Gastrointestinal:210120527} Genitourinary: {Hx; Genitourinary:210120506} Hematological: {Hx; Hematological:210120510} Endocrine: {Hx; Endocrine history:210120509} Rheumatologic: {Hx;  Rheumatological:210120530} Musculoskeletal: {Hx; Musculoskeletal:210120528} Work History: {Hx; Work history:210120514}  Allergies  Connor Holt is allergic to methadone, niacin, other, and oxcarbazepine.  Laboratory Chemistry Profile   Renal Lab Results  Component Value Date   BUN 11 09/01/2017   CREATININE 0.96 09/01/2017   LABCREA 100.3 06/12/2006   GFRAA >60 09/01/2017   GFRNONAA >60 09/01/2017   PROTEINUR Negative 01/30/2011     Electrolytes Lab Results  Component Value Date   NA 134 (L) 09/01/2017   K 4.2 09/01/2017   CL 100 09/01/2017   CALCIUM 8.4 (L) 09/01/2017     Hepatic Lab Results  Component Value Date   AST 24 09/01/2017   ALT 17 09/01/2017   ALBUMIN 3.9 09/01/2017   ALKPHOS 105 09/01/2017     ID Lab Results  Component Value Date   HIV Non Reactive 09/01/2017     Bone No results found for: "VD25OH", "VD125OH2TOT", "KC1275TZ0", "YF7494WH6", "25OHVITD1", "25OHVITD2", "75FFMBWG6", "TESTOFREE", "TESTOSTERONE"   Endocrine Lab Results  Component Value Date   GLUCOSE 349 (H) 09/01/2017   GLUCOSEU Negative 01/30/2011   HGBA1C 7.8 (H) 08/06/2009   TSH 0.125 (L) 09/01/2017   FREET4 1.66 02/12/2014     Neuropathy Lab Results  Component Value Date   VITAMINB12 52 (L) 08/06/2009   FOLATE 9.6 08/06/2009   HGBA1C 7.8 (H) 08/06/2009   HIV Non Reactive 09/01/2017     CNS No results found for: "COLORCSF", "APPEARCSF", "RBCCOUNTCSF", "WBCCSF", "POLYSCSF", "LYMPHSCSF", "EOSCSF", "PROTEINCSF", "GLUCCSF", "JCVIRUS", "CSFOLI", "IGGCSF", "LABACHR", "ACETBL"   Inflammation (CRP: Acute  ESR: Chronic) Lab Results  Component Value Date   CRP 1.6 (H) 09/01/2017   ESRSEDRATE 11 09/01/2017     Rheumatology Lab Results  Component Value Date   RF < 20.0 IU/mL (L) 06/12/2006     Coagulation Lab Results  Component Value Date   PLT 285 09/01/2017     Cardiovascular Lab Results  Component Value Date   HGB 13.9 09/01/2017   HCT 41.0 09/01/2017      Screening Lab Results  Component Value Date   HIV Non Reactive 09/01/2017     Cancer No results found for: "CEA", "CA125", "LABCA2"   Allergens No results found for: "ALMOND", "APPLE", "ASPARAGUS", "AVOCADO", "BANANA", "BARLEY", "BASIL", "BAYLEAF", "GREENBEAN", "LIMABEAN", "WHITEBEAN", "BEEFIGE", "REDBEET", "BLUEBERRY", "BROCCOLI", "CABBAGE", "MELON", "CARROT", "CASEIN", "CASHEWNUT", "CAULIFLOWER", "CELERY"     Note: Lab results reviewed.  PFSH  Drug: Connor Holt  reports no history of drug use. Alcohol:  reports no history of alcohol use. Tobacco:  reports that he has never smoked. His smokeless tobacco use includes snuff. Medical:  has a past medical history of Anxiety, Diabetes mellitus, Dyslipidemia, Hypothyroidism, and Low back pain. Family: family history includes Diabetes in his father.  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   SKIN LESION 05/13/2009   FATIGUE 08/06/2009   Cough 10/28/2008   RASH-NONVESICULAR 08/06/2009   INGUINAL PAIN, RIGHT 12/22/2009   Lumbosacral spondylosis without myelopathy 03/21/2011   Lumbar spondylosis 07/05/2011   Wheezing 02/17/2012   Pelvic floor dysfunction 04/16/2020   Acromioclavicular joint arthritis 03/27/2017   Encounter for orthopedic follow-up care 05/17/2017   Hyperlipidemia 06/20/2021   Hypoglycemia due to type 1 diabetes mellitus ([place]) 06/20/2021   Lateral epicondylitis of right elbow 04/16/2019   Low back pain 06/20/2021   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  04/29/2020   Pain in thumb joint with movement 04/16/2019   Pain in joint of right elbow 04/16/2019   Pain in joint of right shoulder 01/25/2017   Stiffness of shoulder joint, right 05/08/2017   Carpal tunnel syndrome of right wrist 06/19/2017   COVID-19 06/20/2021   Cracked tooth 06/20/2021   Cutaneous abscess of right lower limb 06/20/2021   Localized swelling, mass and lump, right lower limb 06/20/2021   Dental caries on smooth surface penetrating into dentin 06/20/2021   Deposits (accretions) on teeth 06/20/2021   Dizziness 06/20/2021   Encounter for screening for eye and ear disorders 06/20/2021   Dietary counseling and surveillance 06/20/2021   Other specified counseling 06/20/2021   Fasciitis 06/20/2021   Generalized anxiety disorder 06/20/2021   Hypermetropia 06/20/2021   Hypertensive disorder 01/18/2018   Major depressive disorder, recurrent, moderate ([place]) 06/20/2021   Recurrent major depressive episodes, moderate ([place]) 06/20/2021   Sleep apnea 06/20/2021   Pain in joint, ankle and foot 06/20/2021   Pain in left knee 06/05/2019   Pneumonia due to COVID-19 virus 10/15/2019   Polyneuropathy in diabetes ([place]) 06/20/2021   Presbyopia 06/20/2021   Thoracic or lumbosacral neuritis or radiculitis 06/20/2021   Trigger thumb of right hand 04/27/2020   Type 2 diabetes mellitus with diabetic neuropathy, unspecified ([place]) 06/20/2021   Degeneration of lumbar or lumbosacral intervertebral disc 06/20/2021   Other acne 06/20/2021   Type I diabetes mellitus ([place]) 06/20/2021   Diabetes mellitus ([place]) 01/18/2018   Type 1 diabetes mellitus with diabetic neuropathy, unspecified ([place]) 06/20/2021   Type 1 diabetes mellitus without complications ([place]) 23/76/2831   Hypothyroidism 01/18/2018   Vitamin D deficiency 01/18/2018   Hyperlipidemia, unspecified 06/20/2021   Obstructive sleep apnea (adult) (pediatric) 06/20/2021   Peripheral neuropathy 01/18/2018   Chronic pain syndrome 06/22/2021    Pharmacologic therapy 06/22/2021   Disorder of skeletal system 06/22/2021   Problems influencing health status 06/22/2021   Resolved Ambulatory Problems    Diagnosis Date Noted   No Resolved Ambulatory Problems   Past Medical History:  Diagnosis Date   Anxiety    Diabetes mellitus    Dyslipidemia    Constitutional Exam  General appearance: Well nourished, well developed, and well hydrated. In no apparent acute distress There were no vitals filed for this visit. BMI Assessment: Estimated body mass index is 30.52 kg/m as calculated from the following:   Height as of 06/03/21: 6' (1.829 m).   Weight as of 06/03/21: 225 lb (102.1 kg).  BMI interpretation table: BMI level Category Range association with higher incidence of chronic pain  <18 kg/m2 Underweight   18.5-24.9 kg/m2 Ideal body weight   25-29.9 kg/m2 Overweight Increased incidence by 20%  30-34.9 kg/m2  Obese (Class I) Increased incidence by 68%  35-39.9 kg/m2 Severe obesity (Class II) Increased incidence by 136%  >40 kg/m2 Extreme obesity (Class III) Increased incidence by 254%   Patient's current BMI Ideal Body weight  There is no height or weight on file to calculate BMI. Patient weight not recorded   BMI Readings from Last 4 Encounters:  06/03/21 30.52 kg/m  03/09/21 29.95 kg/m  05/28/20 29.90 kg/m  04/16/20 30.00 kg/m   Wt Readings from Last 4 Encounters:  06/03/21 225 lb (102.1 kg)  03/09/21 227 lb (103 kg)  05/28/20 226 lb 9.6 oz (102.8 kg)  04/16/20 227 lb 6.4 oz (103.1 kg)    Psych/Mental status: Alert, oriented x 3 (person, place, & time)       Eyes: PERLA Respiratory: No evidence of acute respiratory distress  Assessment  Primary Diagnosis & Pertinent Problem List: The primary encounter diagnosis was Chronic pain syndrome. Diagnoses of Pharmacologic therapy, Disorder of skeletal system, and Problems influencing health status were also pertinent to this visit.  Visit Diagnosis (New problems to  examiner): 1. Chronic pain syndrome   2. Pharmacologic therapy   3. Disorder of skeletal system   4. Problems influencing health status    Plan of Care (Initial workup plan)  Note: Connor Holt was reminded that as per protocol, today's visit has been an evaluation only. We have not taken over the patient's controlled substance management.  Problem-specific plan: No problem-specific Assessment & Plan notes found for this encounter.  Lab Orders  No laboratory test(s) ordered today   Imaging Orders  No imaging studies ordered today   Referral Orders  No referral(s) requested today   Procedure Orders    No procedure(s) ordered today   Pharmacotherapy (current): Medications ordered:  No orders of the defined types were placed in this encounter.  Medications administered during this visit: Connor Holt. Connor Holt had no medications administered during this visit.   Pharmacological management options:  Opioid Analgesics: The patient was informed that there is no guarantee that he would be a candidate for opioid analgesics. The decision will be made following CDC guidelines. This decision will be based on the results of diagnostic studies, as well as Connor Holt's risk profile.   Membrane stabilizer: To be determined at a later time  Muscle relaxant: To be determined at a later time  NSAID: To be determined at a later time  Other analgesic(s): To be determined at a later time   Interventional management options: Connor Holt was informed that there is no guarantee that he would be a candidate for interventional therapies. The decision will be based on the results of diagnostic studies, as well as Connor Holt's risk profile.  Procedure(s) under consideration:  Pending results of ordered studies      Interventional Therapies  Risk  Complexity Considerations:   Estimated body mass index is 30.52 kg/m as calculated from the following:   Height as of 06/03/21: 6' (1.829 m).   Weight as  of 06/03/21: 225 lb (102.1 kg). WNL   Planned  Pending:   Pending further evaluation   Under consideration:   ***   Completed:   None at this time   Therapeutic  Palliative (PRN) options:   None established      Provider-requested follow-up: No follow-ups on file.  Future Appointments  Date Time Provider [place]  06/23/2021 10:00 AM Milinda Pointer, MD [facility] None  07/20/2021 11:30 AM Kirsteins, Luanna Salk, MD [facility]    Note  by: Gaspar Cola, MD Date: 06/23/2021; Time: 10:30 AM

## 2021-06-22 ENCOUNTER — Encounter: Payer: Self-pay | Admitting: Physical Medicine & Rehabilitation

## 2021-06-22 DIAGNOSIS — G894 Chronic pain syndrome: Secondary | ICD-10-CM | POA: Insufficient documentation

## 2021-06-22 DIAGNOSIS — Z79899 Other long term (current) drug therapy: Secondary | ICD-10-CM | POA: Insufficient documentation

## 2021-06-22 DIAGNOSIS — Z789 Other specified health status: Secondary | ICD-10-CM | POA: Insufficient documentation

## 2021-06-22 DIAGNOSIS — M899 Disorder of bone, unspecified: Secondary | ICD-10-CM | POA: Insufficient documentation

## 2021-06-23 ENCOUNTER — Ambulatory Visit: Payer: Medicare Other | Admitting: Pain Medicine

## 2021-07-12 ENCOUNTER — Ambulatory Visit: Payer: Medicare Other | Admitting: Pain Medicine

## 2021-07-20 ENCOUNTER — Ambulatory Visit: Payer: Medicare Other | Admitting: Physical Medicine & Rehabilitation

## 2021-07-27 ENCOUNTER — Encounter: Payer: Self-pay | Admitting: Physical Medicine & Rehabilitation

## 2021-08-30 ENCOUNTER — Encounter: Payer: Self-pay | Admitting: Physical Medicine & Rehabilitation

## 2021-08-31 ENCOUNTER — Telehealth: Payer: Self-pay

## 2021-08-31 DIAGNOSIS — M6208 Separation of muscle (nontraumatic), other site: Secondary | ICD-10-CM

## 2021-08-31 NOTE — Telephone Encounter (Signed)
Referral sent to Dr. Carolynne Edouard

## 2021-09-06 MED ORDER — TRAMADOL HCL 50 MG PO TABS: ORAL_TABLET | ORAL | 0 refills | Status: DC

## 2021-10-06 ENCOUNTER — Other Ambulatory Visit: Payer: Self-pay | Admitting: Physical Medicine & Rehabilitation

## 2021-10-12 ENCOUNTER — Telehealth: Payer: Self-pay | Admitting: Plastic Surgery

## 2021-10-12 NOTE — Telephone Encounter (Signed)
LVM and My Chart message for pt to contact office to r/s appt 11/11/21 with Dr. Erin Hearing to a new provider.

## 2021-11-05 ENCOUNTER — Ambulatory Visit (INDEPENDENT_AMBULATORY_CARE_PROVIDER_SITE_OTHER): Payer: Medicare Other | Admitting: Plastic Surgery

## 2021-11-05 ENCOUNTER — Encounter: Payer: Self-pay | Admitting: Plastic Surgery

## 2021-11-05 VITALS — BP 138/92 | HR 87 | Ht 73.0 in | Wt 237.4 lb

## 2021-11-05 DIAGNOSIS — M6208 Separation of muscle (nontraumatic), other site: Secondary | ICD-10-CM

## 2021-11-05 NOTE — Progress Notes (Signed)
Referring Provider Kirsteins, Victorino Sparrow, MD [address] [place],  [place] [address]   CC:  Chief Complaint  Patient presents with   Advice Only      Connor Holt is an 56 y.o. male.  HPI: Connor Holt is a 56 year old male who presents for evaluation of a rectus diastases.  He is concerned that the diastases may contribute to pelvic floor pain.  He is curious as to whether any surgical treatment may be indicated for management of his pelvic pain.  Allergies  Allergen Reactions   Methadone     Other reaction(s): OTHER REACTION   Niacin Hives   Other     Other reaction(s): Numbness   Oxcarbazepine     Other reaction(s): Feeling agitated    Outpatient Encounter Medications as of 11/05/2021  Medication Sig Note   cetirizine (ZYRTEC) 10 MG tablet Take by mouth.    Cholecalciferol 50 MCG (2000 UT) TABS Take 1 tablet by mouth daily.    clobetasol (TEMOVATE) 0.05 % external solution Apply topically.    gabapentin (NEURONTIN) 600 MG tablet Take 1 tablet (600 mg total) by mouth 3 (three) times daily.    Glucose 15 GM/32ML GEL TAKE ONE TUBE BY MOUTH AS NEEDED    GLUTOSE 15 40 % GEL Take by mouth.    Insulin Human (INSULIN PUMP) SOLN Inject 0-100 each into the skin continuous. Varies with intake, meals, and exercise. 02/12/2014: Patient does not have pump on    ketoconazole (NIZORAL) 2 % shampoo ketoconazole 2 % shampoo    levothyroxine (SYNTHROID) 150 MCG tablet Take 1 tablet by mouth daily.    losartan (COZAAR) 50 MG tablet TAKE ONE-HALF TABLET BY MOUTH ONCE A DAY ** NOTE DOSE CHANGE    ONE TOUCH ULTRA TEST test strip TEST BLOOD SUGAR 8 OR 9 TIMES DAILY AS DIRECTED    traMADol (ULTRAM) 50 MG tablet TAKE 1 TABLET BY MOUTH THREE TIMES DAILY    glucagon (GLUCAGEN HYPOKIT) 1 MG SOLR injection INJECT 1 UNIT(1MG ) SUBCUTANEOUSLY  AS NEEDED FOR LOW BLOOD GLUCOSE    [DISCONTINUED] acetaminophen (TYLENOL) 500 MG tablet Take by mouth.    [DISCONTINUED] atorvastatin (LIPITOR) 80  MG tablet TAKE ONE TABLET BY MOUTH AT BEDTIME FOR CHOLESTEROL    [DISCONTINUED] DULoxetine (CYMBALTA) 30 MG capsule TAKE ONE CAPSULE BY MOUTH ONCE A DAY WITH SUPPER FOR MOOD AND ANXIETY    [DISCONTINUED] ibuprofen (ADVIL) 800 MG tablet Take 800 mg by mouth 3 (three) times daily.    [DISCONTINUED] insulin aspart (NOVOLOG) 100 UNIT/ML FlexPen Inject into the skin.    [DISCONTINUED] insulin aspart (NOVOLOG) 100 UNIT/ML injection INJECT PROGRAMMABLE PUMP VIA PUMP CONTINUOUS VIA PUMP FOR INFUSION THRU PATIENT'S PUMP (FOR DIABETES) 100 UNITS PER DAY    [DISCONTINUED] methocarbamol (ROBAXIN) 500 MG tablet Take 1 tablet (500 mg total) by mouth every 8 (eight) hours as needed for muscle spasms. Every 8 hours as needed for Muscle Spasms    [DISCONTINUED] vitamin B-12 (CYANOCOBALAMIN) 100 MCG tablet Take 100 mcg by mouth daily.    No facility-administered encounter medications on file as of 11/05/2021.     Past Medical History:  Diagnosis Date   Anxiety    Diabetes mellitus    Dyslipidemia    Hypothyroidism    Low back pain     Past Surgical History:  Procedure Laterality Date   CARDIAC CATHETERIZATION     HERNIA REPAIR      Family History  Problem Relation Age of Onset  Diabetes Father     Social History   Social History Narrative   Not on file     Review of Systems General: Denies fevers, chills, weight loss CV: Denies chest pain, shortness of breath, palpitations Abdomen: Patient noted  bulging in the midline with Valsalva and abdominal exercises.  Physical Exam    11/05/2021    2:30 PM 06/03/2021    1:00 PM 03/09/2021    3:25 PM  Vitals with BMI  Height 6\' 1"  6\' 0"  6\' 1"   Weight 237 lbs 6 oz 225 lbs 227 lbs  BMI 31.33 16.10 96.04  Systolic 540 981 191  Diastolic 92 88 87  Pulse 87 92 86    General:  No acute distress,  Alert and oriented, Non-Toxic, Normal speech and affect Abdomen: The patient has a very small midline diastases.  Do not feel any fascial  defect.  Assessment/Plan Rectus diastases: The patient and I discussed his diastases and I believe that it is very unlikely that this is contributing either to his back pain or to his pelvic pain.  He has increased the amount and types of core exercises that he is doing and he actually notes a decrease in the size of the diastases.  We discussed the fact that repair of the diastases is purely a cosmetic procedure.  He is not interested in any surgical management at this time.  He is welcome to follow-up as needed.  Camillia Herter 11/05/2021, 5:13 PM

## 2021-11-11 ENCOUNTER — Institutional Professional Consult (permissible substitution): Payer: Medicare Other | Admitting: Plastic Surgery

## 2021-12-27 ENCOUNTER — Other Ambulatory Visit: Payer: Self-pay | Admitting: Physical Medicine & Rehabilitation

## 2022-02-18 ENCOUNTER — Other Ambulatory Visit: Payer: Self-pay | Admitting: Physical Medicine & Rehabilitation

## 2022-02-22 ENCOUNTER — Other Ambulatory Visit: Payer: Self-pay

## 2022-02-22 NOTE — Telephone Encounter (Signed)
Filled  Written  ID  Drug  QTY  Days  Prescriber  RX #  Dispenser  Refill  Daily Dose*  Pymt Type  PMP  01/25/2022 12/27/2021 3  Tramadol Hcl 50 Mg Tablet 90.00 30 An Kir [id number] [facility] ([id number]) 0/0 30.00 MME Comm Ins [place] 12/27/2021 12/27/2021 3  Tramadol Hcl 50 Mg Tablet 90.00 30 An Kir [id number] [facility] ([id number]) 0/1 30.00 MME Comm Ins [place]

## 2022-03-01 MED ORDER — TRAMADOL HCL 50 MG PO TABS: 50.0000 mg | ORAL_TABLET | Freq: Three times a day (TID) | ORAL | 1 refills | Status: DC

## 2022-05-24 ENCOUNTER — Other Ambulatory Visit: Payer: Self-pay | Admitting: Physical Medicine & Rehabilitation

## 2022-09-11 IMAGING — MR MR LUMBAR SPINE W/O CM
4 of 5 series · 18 of 48 positions shown · non-contrast
Comparison: 03/15/2010

CLINICAL DATA: Right hip and leg pain.

EXAM:
MRI LUMBAR SPINE WITHOUT CONTRAST
TECHNIQUE: Multiplanar, multisequence MR imaging of the lumbar spine was
performed. No intravenous contrast was administered.

[Series 5: T2 · sagittal · 4.0mm · 0.73mm/px · 6 of 15 slices shown (1 of 2)]
[im 1/15]
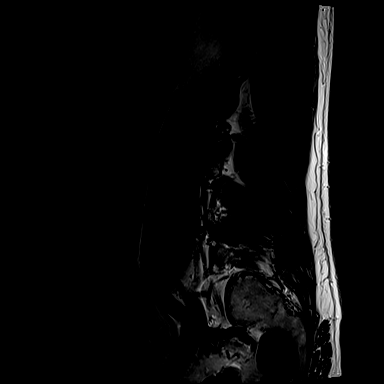
[im 3/15]
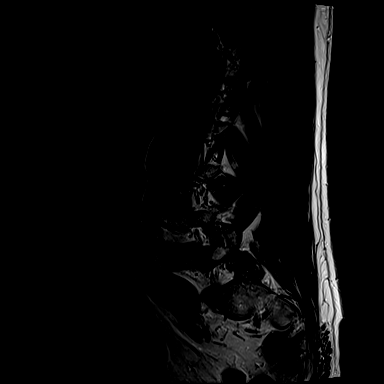
[im 6/15]
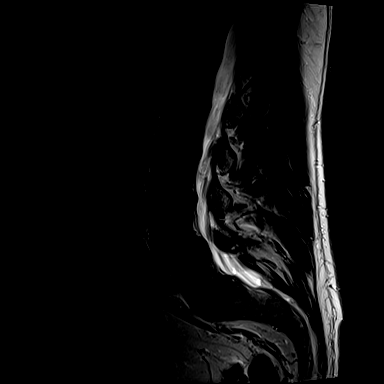
[im 9/15]
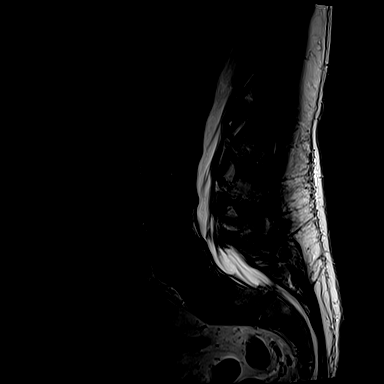
[im 12/15]
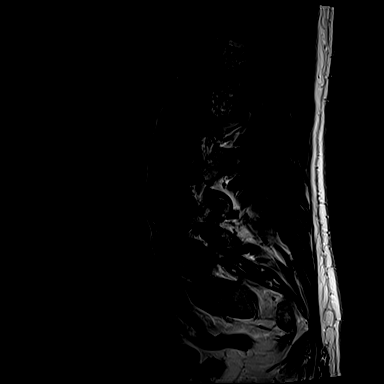
[im 15/15]
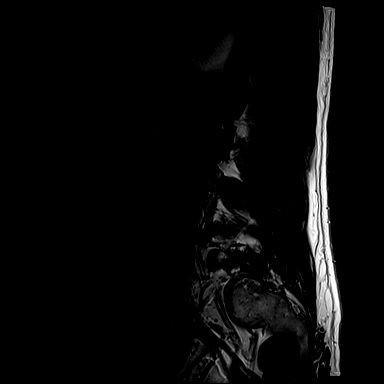

[Series 7: T1 · sagittal · 4.0mm · 0.73mm/px · 3 of 15 slices shown (1 of 2)]
[im 3/15]
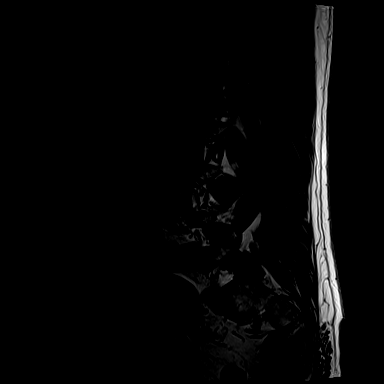
[im 9/15]
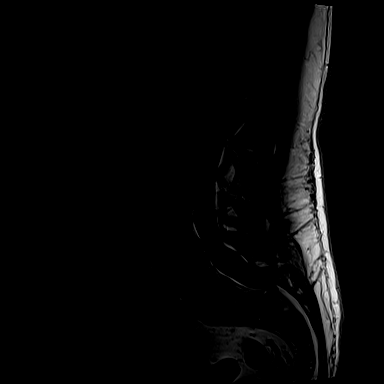
[im 15/15]
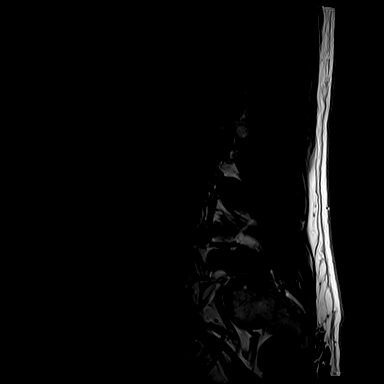

[Series 10: T1 · axial · 4.0mm · 0.28mm/px · z∈[-38,+133]mm · 3 of 41 slices shown (2 of 2)]
[im 6/41]
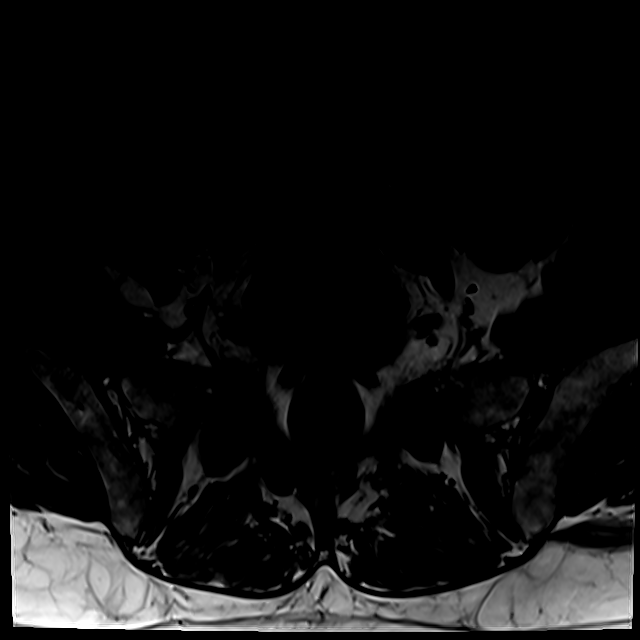
[im 21/41]
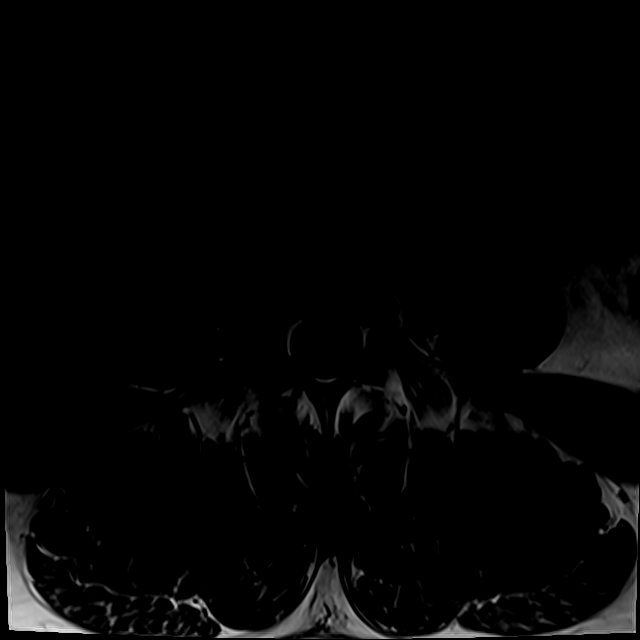
[im 35/41]
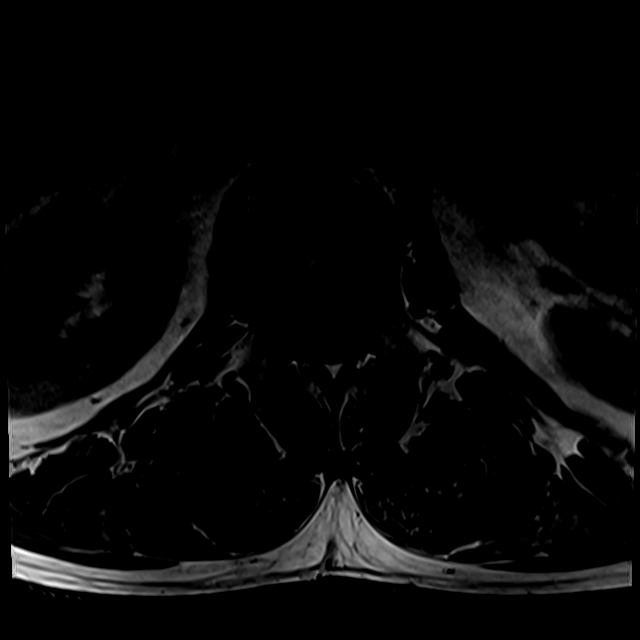

[Series 13: T2 · axial · 4.0mm · 0.28mm/px · z∈[-63,+133]mm · 6 of 41 slices shown (2 of 2)]
[im 1/41]
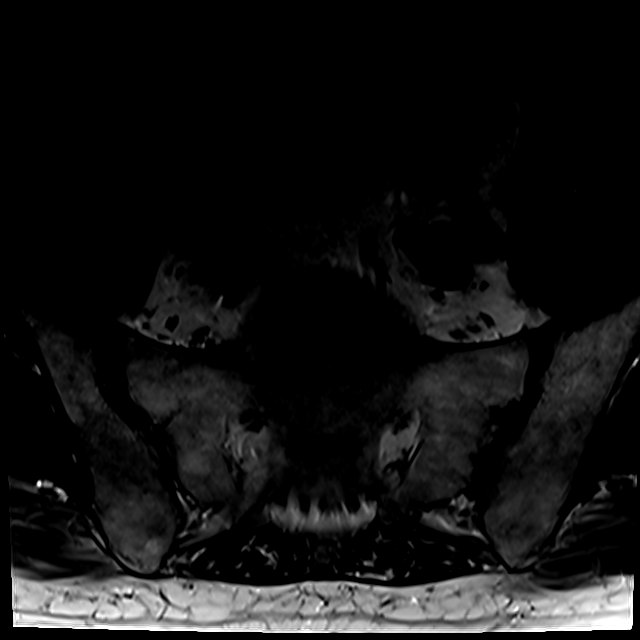
[im 6/41]
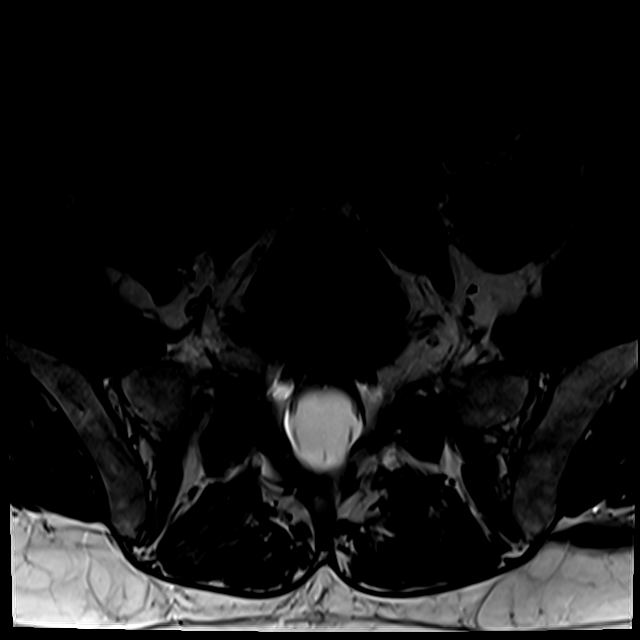
[im 12/41]
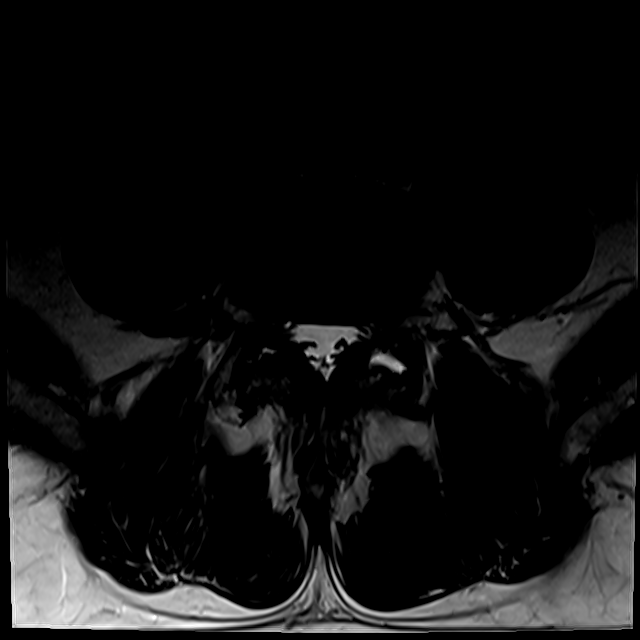
[im 18/41]
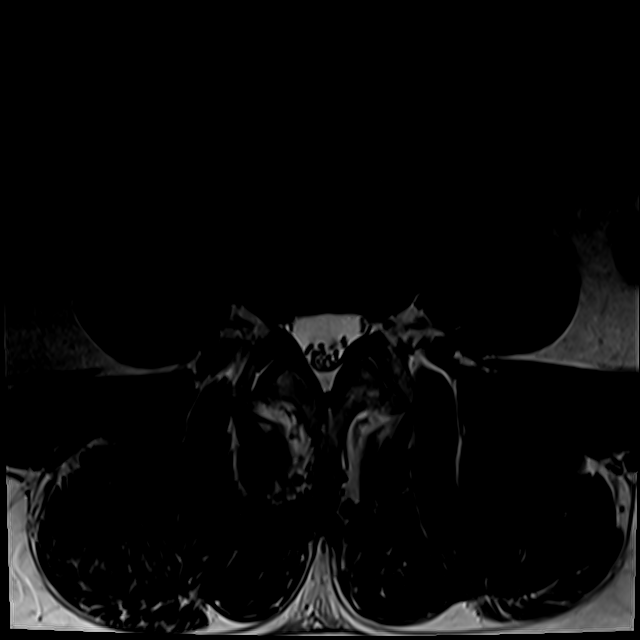
[im 21/41]
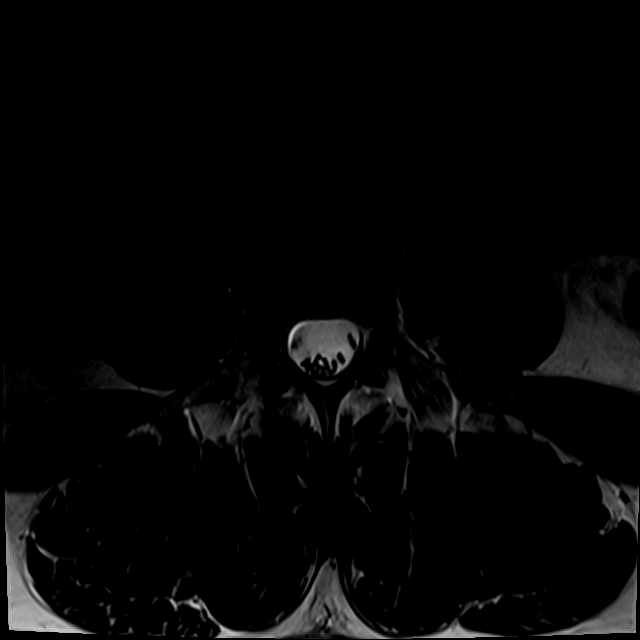
[im 35/41]
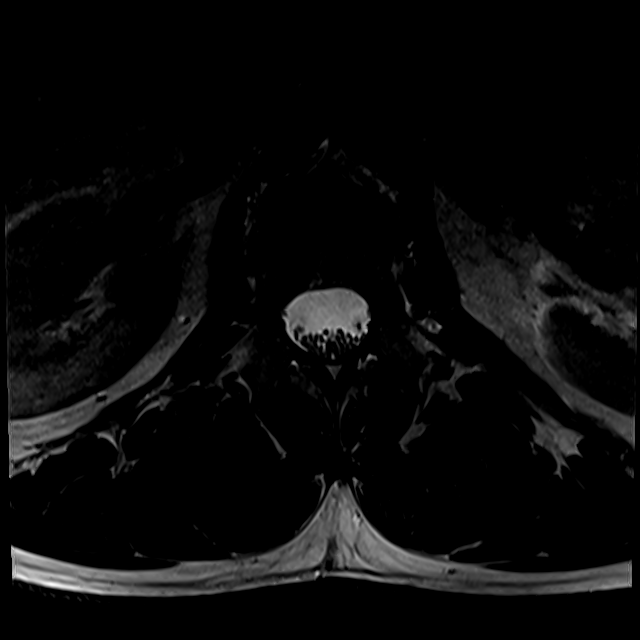

[18 of 48 positions shown; findings below may reference images not displayed]

FINDINGS: Segmentation:  Standard.

Alignment: Minimal grade 1 anterolisthesis of L4 on L5 secondary to
facet disease.

Vertebrae: No acute fracture, evidence of discitis, or aggressive
bone lesion.

Conus medullaris and cauda equina: Conus extends to the L1 level.
Conus and cauda equina appear normal.

Paraspinal and other soft tissues: No acute paraspinal abnormality.

Disc levels:

Disc spaces: Minimal disc height loss at L2-3, L3-4 and L4-5.

T12-L1: No significant disc bulge. No neural foraminal stenosis. No
central canal stenosis.

L1-L2: No significant disc bulge. No neural foraminal stenosis. No
central canal stenosis.

L2-L3: Mild broad-based disc bulge. Mild bilateral facet
arthropathy. No foraminal or central canal stenosis.

L3-L4: Mild broad-based disc bulge. Mild bilateral facet
arthropathy. No foraminal or central canal stenosis.

L4-L5: Mild broad-based disc bulge. Moderate bilateral facet
arthropathy with a left facet effusion. Mild bilateral foraminal
narrowing.

L5-S1: No significant disc bulge. No neural foraminal stenosis. No
central canal stenosis. Moderate bilateral facet arthropathy, right
worse than left.
IMPRESSION: 1. Mild lumbar spine spondylosis as described above.
2. No acute osseous injury of the lumbar spine.

## 2022-11-11 ENCOUNTER — Other Ambulatory Visit: Payer: Self-pay | Admitting: Physical Medicine & Rehabilitation

## 2022-11-18 ENCOUNTER — Other Ambulatory Visit: Payer: Self-pay

## 2022-11-24 ENCOUNTER — Other Ambulatory Visit: Payer: Self-pay

## 2022-11-24 NOTE — Telephone Encounter (Signed)
Pt made an appt for 12/5. Need refill on  Tramadol

## 2022-11-28 ENCOUNTER — Encounter: Payer: Medicare Other | Attending: Registered Nurse | Admitting: Registered Nurse

## 2022-11-28 ENCOUNTER — Encounter: Payer: Self-pay | Admitting: Registered Nurse

## 2022-11-28 VITALS — BP 124/78 | HR 95 | Ht 73.0 in | Wt 236.0 lb

## 2022-11-28 DIAGNOSIS — G8929 Other chronic pain: Secondary | ICD-10-CM | POA: Diagnosis present

## 2022-11-28 DIAGNOSIS — G894 Chronic pain syndrome: Secondary | ICD-10-CM | POA: Insufficient documentation

## 2022-11-28 DIAGNOSIS — Z79891 Long term (current) use of opiate analgesic: Secondary | ICD-10-CM | POA: Insufficient documentation

## 2022-11-28 DIAGNOSIS — R1031 Right lower quadrant pain: Secondary | ICD-10-CM | POA: Insufficient documentation

## 2022-11-28 DIAGNOSIS — Z5181 Encounter for therapeutic drug level monitoring: Secondary | ICD-10-CM | POA: Insufficient documentation

## 2022-11-28 DIAGNOSIS — G5791 Unspecified mononeuropathy of right lower limb: Secondary | ICD-10-CM | POA: Diagnosis present

## 2022-11-28 DIAGNOSIS — M47816 Spondylosis without myelopathy or radiculopathy, lumbar region: Secondary | ICD-10-CM | POA: Insufficient documentation

## 2022-11-28 MED ORDER — TRAMADOL HCL 50 MG PO TABS: 50.0000 mg | ORAL_TABLET | Freq: Three times a day (TID) | ORAL | 5 refills | Status: DC

## 2022-11-28 NOTE — Progress Notes (Unsigned)
Subjective:    Patient ID: Connor Holt, male    DOB: January 12, 1965, 57 y.o.   MRN: [id number]  HPI: Connor Holt is a 57 y.o. male who returns for follow up appointment for chronic pain and medication refill. states *** pain is located in  ***. rates pain ***. current exercise regime is walking and performing stretching exercises.  Connor Holt Morphine equivalent is *** MME.      Pain Inventory Average Pain 4 Pain Right Now 5 My pain is dull and aching  In the last 24 hours, has pain interfered with the following? General activity 6 Relation with others 0 Enjoyment of life 3 What TIME of day is your pain at its worst? daytime and evening Sleep (in general) Fair  Pain is worse with: walking and standing Pain improves with: rest, therapy/exercise, medication, and TENS Relief from Meds: 6  Family History  Problem Relation Age of Onset   Diabetes Father    Social History   Socioeconomic History   Marital status: Married    Spouse name: Not on file   Number of children: Not on file   Years of education: Not on file   Highest education level: Not on file  Occupational History   Not on file  Tobacco Use   Smoking status: Never   Smokeless tobacco: Current    Types: Snuff  Vaping Use   Vaping status: Never Used  Substance and Sexual Activity   Alcohol use: No   Drug use: No   Sexual activity: Not on file  Other Topics Concern   Not on file  Social History Narrative   Not on file   Social Determinants of Health   Financial Resource Strain: Not on file  Food Insecurity: Not on file  Transportation Needs: Not on file  Physical Activity: Not on file  Stress: Not on file  Social Connections: Not on file   Past Surgical History:  Procedure Laterality Date   CARDIAC CATHETERIZATION     HERNIA REPAIR     Past Surgical History:  Procedure Laterality Date   CARDIAC CATHETERIZATION     HERNIA REPAIR     Past Medical History:  Diagnosis Date    Anxiety    Diabetes mellitus    Dyslipidemia    Hypothyroidism    Low back pain    BP 124/78   Pulse 95   Ht 6\' 1"  (1.854 m)   Wt 236 lb (107 kg)   SpO2 96%   BMI 31.14 kg/m   Opioid Risk Score:   Fall Risk Score:  `1  Depression screen [facility] 2/9     11/28/2022    9:31 AM 03/09/2021    3:26 PM 05/28/2020    1:07 PM 03/31/2015    9:57 AM 09/30/2014    3:20 PM  Depression screen PHQ 2/9  Decreased Interest 0 0 1 0 0  Down, Depressed, Hopeless 0 0 1 0 0  PHQ - 2 Score 0 0 2 0 0  Altered sleeping    2   Tired, decreased energy    1   Change in appetite    1   Feeling bad or failure about yourself     0   Trouble concentrating    0   Moving slowly or fidgety/restless    1   Suicidal thoughts    0   PHQ-9 Score    5       Review of Systems  Musculoskeletal:  Positive for back pain.  All other systems reviewed and are negative.     Objective:   Physical Exam        Assessment & Plan:

## 2022-11-30 ENCOUNTER — Encounter: Payer: Self-pay | Admitting: Registered Nurse

## 2022-12-02 LAB — TOXASSURE SELECT,+ANTIDEPR,UR

## 2022-12-15 ENCOUNTER — Ambulatory Visit: Payer: Medicare Other | Admitting: Physical Medicine & Rehabilitation

## 2023-05-19 ENCOUNTER — Encounter: Payer: Medicare Other | Attending: Physical Medicine & Rehabilitation | Admitting: Physical Medicine & Rehabilitation

## 2023-05-19 ENCOUNTER — Encounter: Payer: Self-pay | Admitting: Physical Medicine & Rehabilitation

## 2023-05-19 VITALS — BP 142/97 | HR 96 | Ht 73.0 in | Wt 226.0 lb

## 2023-05-19 DIAGNOSIS — G894 Chronic pain syndrome: Secondary | ICD-10-CM

## 2023-05-19 DIAGNOSIS — Z79891 Long term (current) use of opiate analgesic: Secondary | ICD-10-CM | POA: Diagnosis not present

## 2023-05-19 DIAGNOSIS — Z5181 Encounter for therapeutic drug level monitoring: Secondary | ICD-10-CM

## 2023-05-19 MED ORDER — TRAMADOL HCL 50 MG PO TABS: 50.0000 mg | ORAL_TABLET | Freq: Three times a day (TID) | ORAL | 5 refills | Status: DC

## 2023-05-19 NOTE — Progress Notes (Signed)
 Subjective:    Patient ID: Connor Holt, male    DOB: November 11, 1965, 58 y.o.   MRN: [id number] Last visit 06/03/2021 Chronic groin pain that radiates into the scrotum. Exacerbating factors include twisting. Has had previous work-up about 10 years ago but has had recurrence of symptoms. Appears to be similar to prior. I do think he would be a good candidate for ilioinguinal/iliohypogastric nerve block as a diagnostic/therapeutic tool. We will repeat MRI of the lumbar spine specifically to evaluate for right L1 radiculopathy.  HPI 58 year old male with type 1 diabetes with peripheral neuropathy as well as chronic low back pain with lumbar spondylosis returns today after last visit approximately 2 years ago. The MRI performed in 2023 did not show any significant lumbar stenosis to explain any of his groin pain.  The previous workup included hip MRI which showed some obturator internis tendinitis.  The patient had evaluations by general surgery as well as sports medicine as well as urology regarding his groin pain and workup was unremarkable. Left 1st [facility] sees hand surgery at [facility].  He is eventually planning to have surgery for this likely reconstruction rather than fusion Neuropathy worse Gabapentin   Groin pain R side , MRI 2023 Mild degenerative   Partial tears rotator cuff on the right side what sounds like acromioplasty for this. Pain Inventory Average Pain 6 Pain Right Now 7 My pain is intermittent, constant, sharp, dull, stabbing, tingling, and aching  In the last 24 hours, has pain interfered with the following? General activity 7 Relation with others 6 Enjoyment of life 0 What TIME of day is your pain at its worst? morning , daytime, evening, and night Sleep (in general) Poor  Pain is worse with: walking, bending, and standing Pain improves with: rest, therapy/exercise, pacing activities, and medication Relief from Meds: 7  Family History  Problem Relation  Age of Onset   Diabetes Father    Social History   Socioeconomic History   Marital status: Married    Spouse name: Not on file   Number of children: Not on file   Years of education: Not on file   Highest education level: Not on file  Occupational History   Not on file  Tobacco Use   Smoking status: Never   Smokeless tobacco: Current    Types: Snuff  Vaping Use   Vaping status: Never Used  Substance and Sexual Activity   Alcohol use: No   Drug use: No   Sexual activity: Not on file  Other Topics Concern   Not on file  Social History Narrative   Not on file   Social Drivers of Health   Financial Resource Strain: Not on file  Food Insecurity: Not on file  Transportation Needs: Not on file  Physical Activity: Not on file  Stress: Not on file  Social Connections: Not on file   Past Surgical History:  Procedure Laterality Date   CARDIAC CATHETERIZATION     HERNIA REPAIR     Past Surgical History:  Procedure Laterality Date   CARDIAC CATHETERIZATION     HERNIA REPAIR     Past Medical History:  Diagnosis Date   Anxiety    Diabetes mellitus    Dyslipidemia    Hypothyroidism    Low back pain    There were no vitals taken for this visit.  Opioid Risk Score:   Fall Risk Score:  `1  Depression screen [facility] 2/9     11/28/2022  9:31 AM 03/09/2021    3:26 PM 05/28/2020    1:07 PM 03/31/2015    9:57 AM 09/30/2014    3:20 PM  Depression screen PHQ 2/9  Decreased Interest 0 0 1 0 0  Down, Depressed, Hopeless 0 0 1 0 0  PHQ - 2 Score 0 0 2 0 0  Altered sleeping    2   Tired, decreased energy    1   Change in appetite    1   Feeling bad or failure about yourself     0   Trouble concentrating    0   Moving slowly or fidgety/restless    1   Suicidal thoughts    0   PHQ-9 Score    5     Review of Systems     Objective:   Physical Exam General No acute distress Mood and affect appropriate Lumbar spine range of motion is normal with flexion  extension Negative straight leg raising No tenderness palpation lumbar paraspinal area No evidence of thoracic or lumbar curvature Patient has pain with hip extension in the anterior part of the inguinal area.       Assessment & Plan:  #1.  Chronic low back pain with no evidence of lumbar spinal stenosis last MRI 2023 demonstrating lumbar spondylosis. 2.  Diabetic neuropathy he has been on gabapentin  on a long-term basis which was helping but now does not seem to be as effective.  We also discussed other treatment options including pregabalin and duloxetine.  He had been on duloxetine in the past for anxiety but has been off this for a while.  He will discuss with his primary care physician about this 3.  Left first carpometacarpal osteoarthritis awaiting surgery. Will resume tramadol  50 mg 3 times daily he has multiple pain issues.  Will check oral swab.  Will see him back in clinic in 6 months

## 2023-05-19 NOTE — Patient Instructions (Addendum)
 Dr Janne Members  Duloxetine for neuropathy   Pregabalin for neuropathy

## 2023-05-23 LAB — DRUG TOX MONITOR 1 W/CONF, ORAL FLD
Amphetamines: NEGATIVE ng/mL (ref <10)
Barbiturates: NEGATIVE ng/mL (ref <10)
Benzodiazepines: NEGATIVE ng/mL (ref <0.50)
Buprenorphine: NEGATIVE ng/mL (ref <0.10)
Cocaine: NEGATIVE ng/mL (ref <5.0)
Cotinine: >250 ng/mL — ABNORMAL HIGH (ref <5.0)
Fentanyl: NEGATIVE ng/mL (ref <0.10)
Heroin Metabolite: NEGATIVE ng/mL (ref <1.0)
MARIJUANA: NEGATIVE ng/mL (ref <2.5)
MDMA: NEGATIVE ng/mL (ref <10)
Meprobamate: NEGATIVE ng/mL (ref <2.5)
Methadone: NEGATIVE ng/mL (ref <5.0)
Nicotine Metabolite: POSITIVE ng/mL — AB (ref <5.0)
Opiates: NEGATIVE ng/mL (ref <2.5)
Phencyclidine: NEGATIVE ng/mL (ref <10)
Tapentadol: NEGATIVE ng/mL (ref <5.0)
Tramadol: NEGATIVE ng/mL (ref <5.0)
Zolpidem: NEGATIVE ng/mL (ref <5.0)

## 2023-05-23 LAB — DRUG TOX ALC METAB W/CON, ORAL FLD: Alcohol Metabolite: NEGATIVE ng/mL (ref <25)

## 2023-10-12 ENCOUNTER — Other Ambulatory Visit (HOSPITAL_COMMUNITY): Payer: Self-pay | Admitting: Infectious Diseases

## 2023-10-12 DIAGNOSIS — R1011 Right upper quadrant pain: Secondary | ICD-10-CM

## 2023-10-19 ENCOUNTER — Ambulatory Visit (HOSPITAL_COMMUNITY)
Admission: RE | Admit: 2023-10-19 | Discharge: 2023-10-19 | Disposition: A | Discharge status: Home / Self Care | Payer: Self-pay | Source: Ambulatory Visit | Attending: Infectious Diseases | Admitting: Infectious Diseases

## 2023-10-19 DIAGNOSIS — R1011 Right upper quadrant pain: Secondary | ICD-10-CM | POA: Insufficient documentation

## 2023-11-17 ENCOUNTER — Ambulatory Visit: Admitting: Physical Medicine & Rehabilitation

## 2023-11-20 ENCOUNTER — Encounter: Payer: Self-pay | Admitting: Physical Medicine & Rehabilitation

## 2023-11-20 MED ORDER — TRAMADOL HCL 50 MG PO TABS: 50.0000 mg | ORAL_TABLET | Freq: Three times a day (TID) | ORAL | 5 refills | Status: AC

## 2023-12-05 ENCOUNTER — Encounter: Payer: Self-pay | Admitting: Physical Medicine & Rehabilitation

## 2023-12-05 ENCOUNTER — Encounter: Attending: Physical Medicine & Rehabilitation | Admitting: Physical Medicine & Rehabilitation

## 2023-12-05 VITALS — BP 136/96 | HR 101 | Ht 73.0 in | Wt 226.6 lb

## 2023-12-05 DIAGNOSIS — M47816 Spondylosis without myelopathy or radiculopathy, lumbar region: Secondary | ICD-10-CM | POA: Diagnosis not present

## 2023-12-05 DIAGNOSIS — G588 Other specified mononeuropathies: Secondary | ICD-10-CM | POA: Insufficient documentation

## 2023-12-05 NOTE — Progress Notes (Signed)
 Subjective:    Patient ID: Connor Holt, male    DOB: 05/15/1965, 58 y.o.   MRN: [id number]  HPI 58 year old male with type 1 diabetes and chronic back, right groin pain who returns today with new complaint of right sided rib pain.  This started several months ago.  You had extensive workup by his primary care physician including CT scan, MRI, ultrasound.  They look that potential intra-abdominal or lung etiologies.  He was told that he had shingles without a rash.  He did use Lidoderm with some benefit.  He continued on his gabapentin  600 3 times daily as well as his tramadol  50 3 times daily. Fortunately this pain has subsided although he still has some residual. He is also planning to get a left first carpometacarpal joint replacement.  He is under the care of orthopedics for right shoulder rotator cuff problems and does have right shoulder pain mainly at night. Episode of Shingles without rash vs intercostal neuralgia from diabetes  RIght shoulder  Pain Inventory Average Pain 5 Pain Right Now 3 My pain is dull, stabbing, and aching  In the last 24 hours, has pain interfered with the following? General activity 4 Relation with others 2 Enjoyment of life 2 What TIME of day is your pain at its worst? morning  and evening Sleep (in general) between Fair and Poor  Pain is worse with: walking, standing, and some activites Pain improves with: rest, therapy/exercise, and medication Relief from Meds: 7  Family History  Problem Relation Age of Onset   Diabetes Father    Social History   Socioeconomic History   Marital status: Married    Spouse name: Not on file   Number of children: Not on file   Years of education: Not on file   Highest education level: Not on file  Occupational History   Not on file  Tobacco Use   Smoking status: Never   Smokeless tobacco: Current    Types: Snuff  Vaping Use   Vaping status: Never Used  Substance and Sexual Activity   Alcohol  use: No   Drug use: No   Sexual activity: Not on file  Other Topics Concern   Not on file  Social History Narrative   Not on file   Social Drivers of Health   Financial Resource Strain: Not on file  Food Insecurity: Not on file  Transportation Needs: Not on file  Physical Activity: Not on file  Stress: Not on file  Social Connections: Not on file   Past Surgical History:  Procedure Laterality Date   CARDIAC CATHETERIZATION     HERNIA REPAIR     Past Surgical History:  Procedure Laterality Date   CARDIAC CATHETERIZATION     HERNIA REPAIR     Past Medical History:  Diagnosis Date   Anxiety    Diabetes mellitus    Dyslipidemia    Hypothyroidism    Low back pain    BP (!) 136/96 (BP Location: Left Arm, Patient Position: Sitting, Cuff Size: Large)   Pulse (!) 101   Ht 6' 1 (1.854 m)   Wt 226 lb 9.6 oz (102.8 kg)   SpO2 98%   BMI 29.90 kg/m   Opioid Risk Score:   Fall Risk Score:  `1  Depression screen PHQ 2/9     05/19/2023   12:11 PM 11/28/2022    9:31 AM 03/09/2021    3:26 PM 05/28/2020    1:07 PM 03/31/2015  9:57 AM 09/30/2014    3:20 PM  Depression screen PHQ 2/9  Decreased Interest 0 0 0 1 0 0  Down, Depressed, Hopeless 0 0 0 1 0 0  PHQ - 2 Score 0 0 0 2 0 0  Altered sleeping     2   Tired, decreased energy     1   Change in appetite     1   Feeling bad or failure about yourself      0   Trouble concentrating     0   Moving slowly or fidgety/restless     1   Suicidal thoughts     0    PHQ-9 Score     5       Data saved with a previous flowsheet row definition      Review of Systems  Musculoskeletal:  Positive for arthralgias and back pain.       Right inguinal pain, low back pain, right shoulder pain, left hand pain  All other systems reviewed and are negative.      Objective:   Physical Exam  General no acute distress Mood and affect mildly labile when discussing all of his pain issues Extremities without edema Splint left wrist and  thumb Skin shows no evidence of dermatomal rash or skin lesions in the right flank or rib area.  There is no swelling or deformity in that area.  He points to his lower ribs anterior and lateral aspect as the main rib pain.      Assessment & Plan:  1.  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  We discussed that there is no specific testing for this.  We discussed possibility of doing a nerve block which may produce some short term pain relief.  Ablative procedures may give a longer benefit .  At this point this pain has been subsiding.  He has more issues with his right shoulder and left wrist and feels that he is going to focus on these issues at this time Continue tramadol  50 3 times daily.  We discussed that postoperatively his surgeon would likely give him a 1 week supply of more potent narcotic analgesic then he would resume his tramadol . Continue gabapentin  600 3 times daily this is prescribed by the [place] We discussed potential cognitive impact of tramadol  but the reversible nature of it

## 2023-12-05 NOTE — Patient Instructions (Signed)
Intercostal Nerve Block Patient Information  Description: The twelve intercostal nerves arise from the first thru twelfth thoracic nerve roots.  The nerve begins at the spine and wraps around the body, lying in a groove underneath each rib.  Each intercostal nerve innervates a specific strip of skin and body walk of the abdomen and chest.  Therefore, injuries of the chest wall or abdominal wall result in pain that is transmitted back to the brian via the intercostal nerves.  Examples of such injuries include rib fractures and incisions for lung and gall bladder surgery.  Occasionally, pain may persist long after an injury or surgical incision secondary to inflammation and irritation of the intercostal nerve.  The longstanding pain is known as intercostal neuralgia.  An intercostal nerve block is preformed to eliminate pain either temporarily or permanently.  A small needle is placed below the rib and local anesthetic (like Novocaine) and possibly steroid is injected.  Usually 2-4 intercostal nerves are blocked at a time depending on the problem.  The patient will experience a slight "pin-prick" sensation for each injection.  Shortly thereafter, the strip of skin that is innervated by the blocked intercostal nerve will feel numb.  Persistent pain that is only temporarily relieved with local anesthetic may require a more permanent block. This procedure is called Cryoneurolysis and entails placing a small probe beneath the rib to freeze the nerve.  Conditions that may be treated by intercostal nerve blocks:  Rib fractures Longstanding pain from surgery of the chest or abdomen (intercostal neuralgia) Pain from chest tubes Pain from trauma to the chest  Preparation for the injections:  Do not eat any solid food or dairy products within 8 hours of your appointment. You may drink clear liquids up to 3 hours before appointment.  Clear liquids include water, black coffee, juice or soda.  No milk or cream  please. You may take your regular medication, including pain medications, with a sip of water before your appointment.  Diabetics should hold regular insulin (if take separately) and take 1/2 normal NPH dose the morning of the procedure.   Carry some sugar containing items with you to your appointment. A driver must accompany you and be prepared to drive you home after your procedure. Bring all your current medications with you. An IV may be inserted and sedation may be given at the discretion of the physician. A blood pressure cuff, EKG and other monitors will often be applied during the procedure.  Some patients may need to have extra oxygen administered for a short period. You will be asked to provide medical information, including your allergies, prior to the procedure.  We must know immediately if you are taking blood thinners (like Coumadin/Warfarin) or if you are allergic to IV iodine contrast (dye). We must know if you could possible be pregnant.  Possible side-effects:  Bleeding from needle site Infection (rare) Nerve injury (rare) Numbness & tingling of skin Collapsed lung requiring chest tube (rare) Local anesthetic toxicity (rare) Light-headedness (temporary) Pain at injection site (several days) Decreased blood pressure (temporary) Shortness of breath Jittery/shaking sensation (temporary)  Call if you experience:  Difficulty breathing or hives (go directly to the emergency room) Redness, inflammation or drainage at the injection site Severe pain at the site of the injection Any new symptoms which are concerning   Please note:  Your pain may subside immediately but may return several hours after the injection.  Often, more than one injection is required to reduce the pain. Also,  if several temporary blocks with local anesthetic are ineffective, a more permanent block with cryolysis may be necessary.  This will be discussed with you should this be the case.  If you have  any questions, please call [phone] [facility]

## 2024-02-09 ENCOUNTER — Telehealth: Payer: Self-pay
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
## 2024-06-04 ENCOUNTER — Encounter: Admitting: Physical Medicine & Rehabilitation
